# Patient Record
Sex: Male | Born: 1965 | Race: White | Hispanic: No | Marital: Married | Smoking: Never smoker
Health system: Southern US, Community
[De-identification: ages and names within clinical notes are randomized; demographics above are authoritative.]

## PROBLEM LIST (undated history)

## (undated) HISTORY — PX: HERNIA REPAIR: SHX51

---

## 1997-07-23 ENCOUNTER — Emergency Department (HOSPITAL_COMMUNITY): Admission: EM | Admit: 1997-07-23 | Discharge: 1997-07-23 | Payer: Self-pay | Admitting: Emergency Medicine

## 2002-04-16 ENCOUNTER — Emergency Department (HOSPITAL_COMMUNITY): Admission: EM | Admit: 2002-04-16 | Discharge: 2002-04-16 | Payer: Self-pay | Admitting: Emergency Medicine

## 2002-04-17 ENCOUNTER — Encounter: Payer: Self-pay | Admitting: Emergency Medicine

## 2002-08-13 ENCOUNTER — Ambulatory Visit (HOSPITAL_COMMUNITY): Admission: RE | Admit: 2002-08-13 | Discharge: 2002-08-13 | Payer: Self-pay | Admitting: Gastroenterology

## 2013-01-31 ENCOUNTER — Emergency Department (HOSPITAL_COMMUNITY): Payer: Self-pay

## 2013-01-31 ENCOUNTER — Observation Stay (HOSPITAL_COMMUNITY)
Admission: EM | Admit: 2013-01-31 | Discharge: 2013-02-02 | Disposition: A | Discharge status: Home / Self Care | Payer: Self-pay | Attending: Internal Medicine | Admitting: Internal Medicine

## 2013-01-31 ENCOUNTER — Encounter (HOSPITAL_COMMUNITY): Payer: Self-pay | Admitting: Emergency Medicine

## 2013-01-31 DIAGNOSIS — R0789 Other chest pain: Principal | ICD-10-CM | POA: Insufficient documentation

## 2013-01-31 DIAGNOSIS — R079 Chest pain, unspecified: Secondary | ICD-10-CM | POA: Diagnosis present

## 2013-01-31 DIAGNOSIS — R42 Dizziness and giddiness: Secondary | ICD-10-CM | POA: Insufficient documentation

## 2013-01-31 DIAGNOSIS — R519 Headache, unspecified: Secondary | ICD-10-CM | POA: Diagnosis present

## 2013-01-31 DIAGNOSIS — E876 Hypokalemia: Secondary | ICD-10-CM | POA: Insufficient documentation

## 2013-01-31 DIAGNOSIS — I200 Unstable angina: Secondary | ICD-10-CM

## 2013-01-31 DIAGNOSIS — R0602 Shortness of breath: Secondary | ICD-10-CM | POA: Insufficient documentation

## 2013-01-31 DIAGNOSIS — T463X5A Adverse effect of coronary vasodilators, initial encounter: Secondary | ICD-10-CM | POA: Insufficient documentation

## 2013-01-31 DIAGNOSIS — M25529 Pain in unspecified elbow: Secondary | ICD-10-CM | POA: Insufficient documentation

## 2013-01-31 DIAGNOSIS — L301 Dyshidrosis [pompholyx]: Secondary | ICD-10-CM | POA: Insufficient documentation

## 2013-01-31 DIAGNOSIS — R51 Headache: Secondary | ICD-10-CM

## 2013-01-31 DIAGNOSIS — G4440 Drug-induced headache, not elsewhere classified, not intractable: Secondary | ICD-10-CM | POA: Insufficient documentation

## 2013-01-31 LAB — BASIC METABOLIC PANEL
BUN: 15 mg/dL (ref 6–23)
Calcium: 9.6 mg/dL (ref 8.4–10.5)
Chloride: 100 mEq/L (ref 96–112)
GFR calc non Af Amer: >90 mL/min (ref >90)
Glucose, Bld: 97 mg/dL (ref 70–99)
Potassium: 3.2 mEq/L — ABNORMAL LOW (ref 3.5–5.1)
Sodium: 136 mEq/L (ref 135–145)

## 2013-01-31 LAB — CBC
HCT: 42.1 % (ref 39.0–52.0)
Hemoglobin: 15.5 g/dL (ref 13.0–17.0)
MCH: 32.7 pg (ref 26.0–34.0)
MCHC: 36.8 g/dL — ABNORMAL HIGH (ref 30.0–36.0)
MCV: 88.8 fL (ref 78.0–100.0)
Platelets: 176 10*3/uL (ref 150–400)
RBC: 4.74 MIL/uL (ref 4.22–5.81)

## 2013-01-31 LAB — TROPONIN I: Troponin I: <0.3 ng/mL (ref <0.30)

## 2013-01-31 MED ORDER — POTASSIUM CHLORIDE CRYS ER 20 MEQ PO TBCR
20.0000 meq | EXTENDED_RELEASE_TABLET | Freq: Once | ORAL | Status: AC
  Administered 2013-02-01: 20 meq via ORAL
  Filled 2013-01-31: qty 1

## 2013-01-31 MED ORDER — HEPARIN SODIUM (PORCINE) 5000 UNIT/ML IJ SOLN
5000.0000 [IU] | Freq: Three times a day (TID) | INTRAMUSCULAR | Status: DC
  Administered 2013-02-01: 5000 [IU] via SUBCUTANEOUS
  Filled 2013-01-31 (×4): qty 1

## 2013-01-31 MED ORDER — ACETAMINOPHEN 325 MG PO TABS
650.0000 mg | ORAL_TABLET | ORAL | Status: DC | PRN
  Administered 2013-02-01 (×2): 650 mg via ORAL
  Filled 2013-01-31 (×2): qty 2

## 2013-01-31 MED ORDER — NITROGLYCERIN 0.4 MG SL SUBL
0.4000 mg | SUBLINGUAL_TABLET | SUBLINGUAL | Status: DC | PRN
  Administered 2013-01-31 (×3): 0.4 mg via SUBLINGUAL

## 2013-01-31 MED ORDER — ACETAMINOPHEN 500 MG PO TABS
1000.0000 mg | ORAL_TABLET | Freq: Once | ORAL | Status: AC
  Administered 2013-01-31: 1000 mg via ORAL
  Filled 2013-01-31: qty 2

## 2013-01-31 MED ORDER — ONDANSETRON HCL 4 MG/2ML IJ SOLN
4.0000 mg | Freq: Four times a day (QID) | INTRAMUSCULAR | Status: DC | PRN
Start: 2013-01-31 — End: 2013-02-02

## 2013-01-31 MED ORDER — HYDROCODONE-ACETAMINOPHEN 5-325 MG PO TABS
1.0000 | ORAL_TABLET | Freq: Once | ORAL | Status: AC
  Administered 2013-02-01: 1 via ORAL
  Filled 2013-01-31: qty 1

## 2013-01-31 MED ORDER — ASPIRIN 325 MG PO TABS
325.0000 mg | ORAL_TABLET | ORAL | Status: AC
  Administered 2013-01-31: 325 mg via ORAL
  Filled 2013-01-31: qty 1

## 2013-01-31 NOTE — ED Notes (Signed)
Pt here for c/o chest pain to L chest, radiating to L arm started at 1830. PT states he feels weak and lightheaded at times.

## 2013-01-31 NOTE — ED Notes (Signed)
Pt called for c/o headache. MD notified.

## 2013-01-31 NOTE — ED Provider Notes (Addendum)
CSN: [id number]     Arrival date & time 01/31/13  1959 History   First MD Initiated Contact with Patient 01/31/13 2022     Chief Complaint  Patient presents with  . Chest Pain   (Consider location/radiation/quality/duration/timing/severity/associated sxs/prior Treatment) HPI Comments: Patient presents with chest pain. He started having sudden onset of pain in his left chest about 2 hours prior to arrival in emergency pertinent. Describes as an achy feeling in the left side of his chest it radiates to his neck and left arm. He has associated shortness of breath. He also had some lightheadedness. He denies any nausea or vomiting. He denies any pleuritic-type chest pain. He denies any leg pain or swelling. He denies any cough or chest congestion. He denies any history of heart problems in the past. He denies any tobacco use. He denies a family history of early heart disease.   History reviewed. No pertinent past medical history. History reviewed. No pertinent past surgical history. History reviewed. No pertinent family history. History  Substance Use Topics  . Smoking status: Never Smoker   . Smokeless tobacco: Not on file  . Alcohol Use: No    Review of Systems  Constitutional: Positive for diaphoresis. Negative for fever, chills and fatigue.  HENT: Negative for congestion, rhinorrhea and sneezing.   Eyes: Negative.   Respiratory: Positive for chest tightness and shortness of breath. Negative for cough.   Cardiovascular: Positive for chest pain. Negative for leg swelling.  Gastrointestinal: Negative for nausea, vomiting, abdominal pain, diarrhea and blood in stool.  Genitourinary: Negative for frequency, hematuria, flank pain and difficulty urinating.  Musculoskeletal: Negative for arthralgias and back pain.  Skin: Negative for rash.  Neurological: Positive for light-headedness. Negative for dizziness, speech difficulty, weakness, numbness and headaches.    Allergies  Sulfa  antibiotics  Home Medications  No current outpatient prescriptions on file. BP 94/68  Pulse 67  Temp(Src) 98.3 F (36.8 C) (Oral)  Resp 12  Wt 190 lb (86.183 kg)  SpO2 94% Physical Exam  Constitutional: He is oriented to person, place, and time. He appears well-developed and well-nourished.  Slightly diaphoretic  HENT:  Head: Normocephalic and atraumatic.  Eyes: Pupils are equal, round, and reactive to light.  Neck: Normal range of motion. Neck supple.  Cardiovascular: Normal rate, regular rhythm and normal heart sounds.   Pulmonary/Chest: Effort normal and breath sounds normal. No respiratory distress. He has no wheezes. He has no rales. He exhibits no tenderness.  Abdominal: Soft. Bowel sounds are normal. There is no tenderness. There is no rebound and no guarding.  Musculoskeletal: Normal range of motion. He exhibits no edema.  Lymphadenopathy:    He has no cervical adenopathy.  Neurological: He is alert and oriented to person, place, and time.  Skin: Skin is warm and dry. No rash noted.  Psychiatric: He has a normal mood and affect.    ED Course  Procedures (including critical care time) Labs Review Results for orders placed during the hospital encounter of 01/31/13  CBC      Result Value Range   WBC 6.9  4.0 - 10.5 K/uL   RBC 4.74  4.22 - 5.81 MIL/uL   Hemoglobin 15.5  13.0 - 17.0 g/dL   HCT 78.2  95.6 - 21.3 %   MCV 88.8  78.0 - 100.0 fL   MCH 32.7  26.0 - 34.0 pg   MCHC 36.8 (*) 30.0 - 36.0 g/dL   RDW 08.6  57.8 - 46.9 %  Platelets 176  150 - 400 K/uL  BASIC METABOLIC PANEL      Result Value Range   Sodium 136  135 - 145 mEq/L   Potassium 3.2 (*) 3.5 - 5.1 mEq/L   Chloride 100  96 - 112 mEq/L   CO2 23  19 - 32 mEq/L   Glucose, Bld 97  70 - 99 mg/dL   BUN 15  6 - 23 mg/dL   Creatinine, Ser 1.61  0.50 - 1.35 mg/dL   Calcium 9.6  8.4 - 09.6 mg/dL   GFR calc non Af Amer >90  >90 mL/min   GFR calc Af Amer >90  >90 mL/min  TROPONIN I      Result Value Range    Troponin I <0.30  <0.30 ng/mL   Dg Chest 2 View  01/31/2013   CLINICAL DATA:  Left-sided chest pain, traveling down left arm for 1 day.  EXAM: CHEST  2 VIEW  COMPARISON:  CT abdomen and pelvis 03/15/2008  FINDINGS: Normal heart size and pulmonary vascularity. Scattered interstitial changes in the lungs with scattered calcified granulomas. No focal airspace consolidation. No blunting of costophrenic angles. No pneumothorax.  IMPRESSION: No active cardiopulmonary disease.   Electronically Signed   By: Burman Nieves M.D.   On: 01/31/2013 21:22     Imaging Review Dg Chest 2 View  01/31/2013   CLINICAL DATA:  Left-sided chest pain, traveling down left arm for 1 day.  EXAM: CHEST  2 VIEW  COMPARISON:  CT abdomen and pelvis 03/15/2008  FINDINGS: Normal heart size and pulmonary vascularity. Scattered interstitial changes in the lungs with scattered calcified granulomas. No focal airspace consolidation. No blunting of costophrenic angles. No pneumothorax.  IMPRESSION: No active cardiopulmonary disease.   Electronically Signed   By: Burman Nieves M.D.   On: 01/31/2013 21:22    EKG Interpretation     Ventricular Rate:  71 PR Interval:  184 QRS Duration: 84 QT Interval:  384 QTC Calculation: 418 R Axis:   -8 Text Interpretation:  Sinus rhythm Probable left atrial enlargement since last tracing no significant change            MDM   1. Chest pain    Patient presents with chest pain. He has no ischemic changes on EKG. His troponin is negative. He does however have a concerning story and he was slightly diaphoretic on arrival. His pain is exertional. Given this I feel that it would be best to admit him for serial troponins and monitoring. He did receive aspirin in the ED as well as 3 nitroglycerin and he was pain-free after the nitroglycerin.    Rolan Bucco, MD 01/31/13 0454  Rolan Bucco, MD 01/31/13 2223

## 2013-01-31 NOTE — ED Notes (Addendum)
Presents with chest tightness leftsided with radiation into jaw and left arm began at 18:30 this evening associated with SOB and pain is constant, "Foogy headedness" denies nausea. Nothing makes better, walking and exertion makes pain wrose, lifting left arm up makes pain worse.

## 2013-01-31 NOTE — ED Notes (Signed)
DG called and updated pt ready for xray.

## 2013-01-31 NOTE — H&P (Signed)
[facility] History and Physical  [name] ZOX:[id number] DOB: [birth year] DOA: 01/31/2013  Referring physician: ED PCP: No primary provider on file.  Chief Complaint: Chest pain  HPI: Todd Griffin is a 47 y.o. male who presents with c/o chest pain.  Pain was left sided with radiation to arm and jaw.  Pain was worse with exertion, resolved with NTG.  There was associated SOB.  He is currently pain free.  Symptoms occurred about 2 hours PTA.  Review of Systems: 12 systems reviewed and otherwise negative.  History reviewed. No pertinent past medical history. History reviewed. No pertinent past surgical history. Social History:  reports that he has never smoked. He does not have any smokeless tobacco history on file. He reports that he does not drink alcohol or use illicit drugs.   Allergies  Allergen Reactions  . Sulfa Antibiotics     unknown    History reviewed. No pertinent family history.  Prior to Admission medications   Not on File   Physical Exam: Filed Vitals:   01/31/13 2242  BP: 94/61  Pulse: 68  Temp:   Resp: 17    General:  NAD, resting comfortably in bed Eyes: PEERLA EOMI ENT: mucous membranes moist Neck: supple w/o JVD Cardiovascular: RRR w/o MRG Respiratory: CTA B Abdomen: soft, nt, nd, bs+ Skin: no rash nor lesion Musculoskeletal: MAE, full ROM all 4 extremities Psychiatric: normal tone and affect Neurologic: AAOx3, grossly non-focal  Labs on Admission:  Basic Metabolic Panel:  Recent Labs Lab 01/31/13 2104  NA 136  K 3.2*  CL 100  CO2 23  GLUCOSE 97  BUN 15  CREATININE 0.92  CALCIUM 9.6   Liver Function Tests: No results found for this basename: AST, ALT, ALKPHOS, BILITOT, PROT, ALBUMIN,  in the last 168 hours No results found for this basename: LIPASE, AMYLASE,  in the last 168 hours No results found for this basename: AMMONIA,  in the last 168 hours CBC:  Recent Labs Lab 01/31/13 2104  WBC 6.9  HGB 15.5  HCT 42.1   MCV 88.8  PLT 176   Cardiac Enzymes:  Recent Labs Lab 01/31/13 2104  TROPONINI <0.30    BNP (last 3 results) No results found for this basename: PROBNP,  in the last 8760 hours CBG: No results found for this basename: GLUCAP,  in the last 168 hours  Radiological Exams on Admission: Dg Chest 2 View  01/31/2013   CLINICAL DATA:  Left-sided chest pain, traveling down left arm for 1 day.  EXAM: CHEST  2 VIEW  COMPARISON:  CT abdomen and pelvis 03/15/2008  FINDINGS: Normal heart size and pulmonary vascularity. Scattered interstitial changes in the lungs with scattered calcified granulomas. No focal airspace consolidation. No blunting of costophrenic angles. No pneumothorax.  IMPRESSION: No active cardiopulmonary disease.   Electronically Signed   By: Burman Nieves M.D.   On: 01/31/2013 21:22    EKG: Independently reviewed.  Assessment/Plan Principal Problem:   Chest pain   1. Chest pain - HEART score of 4, but his history especially certianly sounds almost classic for cardiac angina symptoms.  Admitting for serial troponins, tele monitor, despite his low heart score likely will require stress test wether inpatient or outpatient.    Code Status: Full (must indicate code status--if unknown or must be presumed, indicate so) Family Communication: Wife at bedside (indicate person spoken with, if applicable, with phone number if by telephone) Disposition Plan: Admit to obs (indicate anticipated LOS)  Time spent: 50 min  GARDNER, JARED M. [facility] Pager [phone]  If 7PM-7AM, please contact night-coverage [email or web address] Password [email or web address] 01/31/2013, 11:21 PM

## 2013-02-01 ENCOUNTER — Encounter (HOSPITAL_COMMUNITY)
Admission: EM | Disposition: A | Discharge status: Home / Self Care | Payer: Self-pay | Source: Home / Self Care | Attending: Emergency Medicine

## 2013-02-01 DIAGNOSIS — I200 Unstable angina: Secondary | ICD-10-CM

## 2013-02-01 DIAGNOSIS — R51 Headache: Secondary | ICD-10-CM | POA: Diagnosis present

## 2013-02-01 DIAGNOSIS — E876 Hypokalemia: Secondary | ICD-10-CM | POA: Diagnosis present

## 2013-02-01 DIAGNOSIS — R079 Chest pain, unspecified: Secondary | ICD-10-CM

## 2013-02-01 DIAGNOSIS — R519 Headache, unspecified: Secondary | ICD-10-CM | POA: Diagnosis present

## 2013-02-01 HISTORY — PX: LEFT HEART CATHETERIZATION WITH CORONARY ANGIOGRAM: SHX5451

## 2013-02-01 LAB — BASIC METABOLIC PANEL
BUN: 13 mg/dL (ref 6–23)
CO2: 26 mEq/L (ref 19–32)
Calcium: 9 mg/dL (ref 8.4–10.5)
Chloride: 102 mEq/L (ref 96–112)
GFR calc non Af Amer: >90 mL/min (ref >90)
Glucose, Bld: 86 mg/dL (ref 70–99)
Potassium: 3.8 mEq/L (ref 3.5–5.1)
Sodium: 138 mEq/L (ref 135–145)

## 2013-02-01 LAB — PROTIME-INR: INR: 0.94 (ref 0.00–1.49)

## 2013-02-01 SURGERY — LEFT HEART CATHETERIZATION WITH CORONARY ANGIOGRAM
Anesthesia: LOCAL

## 2013-02-01 MED ORDER — SODIUM CHLORIDE 0.9 % IJ SOLN: 3.0000 mL | INTRAMUSCULAR | Status: DC | PRN

## 2013-02-01 MED ORDER — SODIUM CHLORIDE 0.9 % IV SOLN: 250.0000 mL | INTRAVENOUS | Status: DC | PRN

## 2013-02-01 MED ORDER — FENTANYL CITRATE 0.05 MG/ML IJ SOLN
INTRAMUSCULAR | Status: AC
  Filled 2013-02-01: qty 2

## 2013-02-01 MED ORDER — HYDROCODONE-ACETAMINOPHEN 5-325 MG PO TABS
1.0000 | ORAL_TABLET | Freq: Once | ORAL | Status: AC
  Administered 2013-02-01: 1 via ORAL
  Filled 2013-02-01: qty 1

## 2013-02-01 MED ORDER — ASPIRIN 81 MG PO CHEW
81.0000 mg | CHEWABLE_TABLET | ORAL | Status: AC
  Administered 2013-02-01: 81 mg via ORAL
  Filled 2013-02-01: qty 1

## 2013-02-01 MED ORDER — LIDOCAINE HCL (PF) 1 % IJ SOLN
INTRAMUSCULAR | Status: AC
  Filled 2013-02-01: qty 30

## 2013-02-01 MED ORDER — VERAPAMIL HCL 2.5 MG/ML IV SOLN
INTRAVENOUS | Status: AC
  Filled 2013-02-01: qty 2

## 2013-02-01 MED ORDER — SODIUM CHLORIDE 0.9 % IV SOLN: INTRAVENOUS | Status: AC

## 2013-02-01 MED ORDER — HEPARIN SODIUM (PORCINE) 1000 UNIT/ML IJ SOLN
INTRAMUSCULAR | Status: AC
  Filled 2013-02-01: qty 1

## 2013-02-01 MED ORDER — SODIUM CHLORIDE 0.9 % IV SOLN
INTRAVENOUS | Status: DC
  Administered 2013-02-01: 1000 mL via INTRAVENOUS

## 2013-02-01 MED ORDER — SODIUM CHLORIDE 0.9 % IV SOLN
INTRAVENOUS | Status: DC
  Administered 2013-02-01: 20:00:00 via INTRAVENOUS

## 2013-02-01 MED ORDER — HYDROCODONE-ACETAMINOPHEN 5-325 MG PO TABS
1.0000 | ORAL_TABLET | ORAL | Status: DC | PRN
  Administered 2013-02-01: 1 via ORAL
  Filled 2013-02-01: qty 1

## 2013-02-01 MED ORDER — NITROGLYCERIN 0.2 MG/ML ON CALL CATH LAB
INTRAVENOUS | Status: AC
  Filled 2013-02-01: qty 1

## 2013-02-01 MED ORDER — MIDAZOLAM HCL 2 MG/2ML IJ SOLN
INTRAMUSCULAR | Status: AC
  Filled 2013-02-01: qty 2

## 2013-02-01 MED ORDER — SODIUM CHLORIDE 0.9 % IJ SOLN
3.0000 mL | Freq: Two times a day (BID) | INTRAMUSCULAR | Status: DC
  Administered 2013-02-01: 3 mL via INTRAVENOUS

## 2013-02-01 MED ORDER — HEPARIN (PORCINE) IN NACL 2-0.9 UNIT/ML-% IJ SOLN
INTRAMUSCULAR | Status: AC
  Filled 2013-02-01: qty 1000

## 2013-02-01 NOTE — Interval H&P Note (Signed)
History and Physical Interval Note:  02/01/2013 3:22 PM  Todd Griffin  has presented today for cardiac cath with the diagnosis of unstable angina. The various methods of treatment have been discussed with the patient and family. After consideration of risks, benefits and other options for treatment, the patient has consented to  Procedure(s): LEFT HEART CATHETERIZATION WITH CORONARY ANGIOGRAM (N/A) as a surgical intervention .  The patient's history has been reviewed, patient examined, no change in status, stable for surgery.  I have reviewed the patient's chart and labs.  Questions were answered to the patient's satisfaction.    Cath Lab Visit (complete for each Cath Lab visit)  Clinical Evaluation Leading to the Procedure:   ACS: no  Non-ACS:    Anginal Classification: CCS III  Anti-ischemic medical therapy: No Therapy  Non-Invasive Test Results: No non-invasive testing performed  Prior CABG: No previous CABG        MCALHANY,CHRISTOPHER

## 2013-02-01 NOTE — Progress Notes (Signed)
UR Completed Wateen Varon Graves-Bigelow, RN,BSN [phone]  

## 2013-02-01 NOTE — Progress Notes (Signed)
Pt c/o pain in R forearm, 8/10; site without hematoma, good wave form on pulse ox, pt 94% on RA; Janell Quiet paged and made aware, new orders received; will continue to monitor

## 2013-02-01 NOTE — H&P (View-Only) (Signed)
    CONSULT NOTE  Date: 02/01/2013               Patient Name:  Todd Griffin MRN: [id number]  DOB: 04/05/1966 Age / Sex: 47 y.o., male        PCP: BURNETT,BRENT A Primary Cardiologist: New/ Nahser            Referring Physician: Corina Sullivan, MD              Reason for Consult: Chest pain, unstable angina           History of Present Illness: Patient is a 47 y.o. male with no significant PMH , who was admitted to [facility] on 01/31/2013 for evaluation of chest discomfort.    He had CP for 2 hours - relieved with SL NTG.   ECG is nonacute.  The pain was 10 / 10.  The  Severe pressure, tightness, radiated up to left shoulder and then down left arm.  Worsened while walking into the ER and then was relieved with SL NTG.  Non - pleuretic.     Medications: Outpatient medications: No prescriptions prior to admission    Current medications: Current Facility-Administered Medications  Medication Dose Route Frequency Provider Last Rate Last Dose  . acetaminophen (TYLENOL) tablet 650 mg  650 mg Oral Q4H PRN Jared M. Gardner, DO   650 mg at 02/01/13 0802  . heparin injection 5,000 Units  5,000 Units Subcutaneous Q8H Jared M. Gardner, DO      . nitroGLYCERIN (NITROSTAT) SL tablet 0.4 mg  0.4 mg Sublingual Q5 min PRN Melanie Belfi, MD   0.4 mg at 01/31/13 2046  . ondansetron (ZOFRAN) injection 4 mg  4 mg Intravenous Q6H PRN Jared M. Gardner, DO         Allergies  Allergen Reactions  . Sulfa Antibiotics     unknown     History reviewed. No pertinent past medical history.  History reviewed. No pertinent past surgical history.  History reviewed. No pertinent family history.  Social History:  reports that he has never smoked. He does not have any smokeless tobacco history on file. He reports that he does not drink alcohol or use illicit drugs.   Review of Systems: Constitutional:  denies fever, chills, diaphoresis, appetite change and fatigue.  HEENT: denies photophobia, eye  pain, redness, hearing loss, ear pain, congestion, sore throat, rhinorrhea, sneezing, neck pain, neck stiffness and tinnitus.  Respiratory: denies SOB, DOE, cough, chest tightness, and wheezing.  Cardiovascular: admits to chest pain   Gastrointestinal: denies nausea, vomiting, abdominal pain, diarrhea, constipation, blood in stool.  Genitourinary: denies dysuria, urgency, frequency, hematuria, flank pain and difficulty urinating.  Musculoskeletal: denies  myalgias, back pain, joint swelling, arthralgias and gait problem.   Skin: denies pallor, rash and wound.  Neurological: denies dizziness, seizures, syncope, weakness, light-headedness, numbness and headaches.   Hematological: denies adenopathy, easy bruising, personal or family bleeding history.  Psychiatric/ Behavioral: denies suicidal ideation, mood changes, confusion, nervousness, sleep disturbance and agitation.    Physical Exam: BP 95/64  Pulse 61  Temp(Src) 97.7 F (36.5 C) (Oral)  Resp 16  Ht 5' 10" (1.778 m)  Wt 188 lb 12.8 oz (85.639 kg)  BMI 27.09 kg/m2  SpO2 96%  General: Vital signs reviewed and noted. Well-developed, well-nourished, in no acute distress; alert, appropriate and cooperative throughout examination.  Head: Normocephalic, atraumatic, sclera anicteric, mucus membranes are moist  Neck: Supple. Negative for carotid bruits. JVD not elevated.  Lungs:    Clear bilaterally to auscultation without wheezes, rales, or rhonchi. Breathing is normal.   Heart: RRR with S1 S2. No murmurs, rubs, or gallops.  Chest wall is nontender.  Abdomen:  Soft, non-tender, non-distended with normoactive bowel sounds. No hepatomegaly. No rebound/guarding. No obvious abdominal masses  MSK: Strength and the appear normal for age.  Extremities: No clubbing or cyanosis. No edema.  Distal pedal pulses are 2+ and equal bilaterally.  Neurologic: Alert and oriented X 3. Moves all extremities spontaneously.  Psych: Responds to questions  appropriately with a normal affect.    Lab results: Basic Metabolic Panel:  Recent Labs Lab 01/31/13 2104  NA 136  K 3.2*  CL 100  CO2 23  GLUCOSE 97  BUN 15  CREATININE 0.92  CALCIUM 9.6    Liver Function Tests: No results found for this basename: AST, ALT, ALKPHOS, BILITOT, PROT, ALBUMIN,  in the last 168 hours No results found for this basename: LIPASE, AMYLASE,  in the last 168 hours No results found for this basename: AMMONIA,  in the last 168 hours  CBC:  Recent Labs Lab 01/31/13 2104  WBC 6.9  HGB 15.5  HCT 42.1  MCV 88.8  PLT 176    Cardiac Enzymes:  Recent Labs Lab 01/31/13 2104 02/01/13 0723  TROPONINI <0.30 <0.30    BNP: No components found with this basename: POCBNP,   CBG: No results found for this basename: GLUCAP,  in the last 168 hours  Coagulation Studies: No results found for this basename: LABPROT, INR,  in the last 72 hours   Other results: EKG: NSR, no ST or T wave changes.   Imaging: Dg Chest 2 View  01/31/2013   CLINICAL DATA:  Left-sided chest pain, traveling down left arm for 1 day.  EXAM: CHEST  2 VIEW  COMPARISON:  CT abdomen and pelvis 03/15/2008  FINDINGS: Normal heart size and pulmonary vascularity. Scattered interstitial changes in the lungs with scattered calcified granulomas. No focal airspace consolidation. No blunting of costophrenic angles. No pneumothorax.  IMPRESSION: No active cardiopulmonary disease.   Electronically Signed   By: William  Stevens M.D.   On: 01/31/2013 21:22      Assessment & Plan: 1. Chest discomfort.  Unstable angina.  His symptoms are worrisome.   The pain was typical for unstable angina - pressure, radiation to shoulder and arm, relieved with SL NTG.  The pain was not pleuretic.  No chest wall tenderness. Not associated with eating.   I would favor doing a cardiac cath today.  I have discussed the risks, benefits, options.  He and his wife understand and agree to proceed.   Will give him  some liquids this am. Will start IVF   Philip J. Nahser, Jr., MD, FACC 02/01/2013, 10:06 AM      

## 2013-02-01 NOTE — CV Procedure (Signed)
      Cardiac Catheterization Operative Report  Todd Griffin [id number] 10/20/20143:28 PM Marjory Lies A, MD  Procedure Performed:  1. Left Heart Catheterization 2. Selective Coronary Angiography 3. Left ventricular angiogram  Operator: Verne Carrow, MD  Arterial access site:  Right radial artery.   Indication:  47 yo male with no signficant past medical history, non-smoker, no FH of CAD admitted with chest pain concerning for unstable angina. Cardiac markers negative.                                      Procedure Details: The risks, benefits, complications, treatment options, and expected outcomes were discussed with the patient. The patient and/or family concurred with the proposed plan, giving informed consent. The patient was brought to the cath lab after IV hydration was begun and oral premedication was given. The patient was further sedated with Versed and Fentanyl. The right wrist was assessed with an Allens test which was positive. The right wrist was prepped and draped in a sterile fashion. 1% lidocaine was used for local anesthesia. Using the modified Seldinger access technique, a 5/6 [place] hybrid sheath was placed in the right radial artery. 3 mg Verapamil was given through the sheath. 4500 units IV heparin was given. Standard diagnostic catheters were used to perform selective coronary angiography. A pigtail catheter was used to perform a left ventricular angiogram. The sheath was removed from the right radial artery and a Terumo hemostasis band was applied at the arteriotomy site on the right wrist.   There were no immediate complications. The patient was taken to the recovery area in stable condition.   Hemodynamic Findings: Central aortic pressure: 88/52 Left ventricular pressure: 82/4/5  Angiographic Findings:  Left main: No obstructive disease.   Left Anterior Descending Artery: Large caliber vessel that courses to the apex. There are two small to  moderate caliber diagonal branches. No obstructive disease.   Circumflex Artery: Large caliber vessel with two moderate caliber obtuse marginal branches. No obstructive disease.   Right Coronary Artery: Large dominant vessel with no obstructive disease.   Left Ventricular Angiogram: LVEF=60%.   Impression: 1. No angiographic evidence of CAD 2. Normal LV systolic function 3. Non-cardiac chest pain.   Recommendations: No further ischemic workup.        Complications:  None. The patient tolerated the procedure well.

## 2013-02-01 NOTE — Progress Notes (Addendum)
Chart reviewed.  PCP is Dr. Rosezetta Schlatter  Subjective: Some arm and shoulder pain. Better with elevation.  No CP. C/o HA unrelieved by tylenol.  Has not eaten yet.  CP worse with exertion yesterday, diaphoretic, dyspneic, relieved by nitroglycerin. Uncles and grandparents with early heart disease.  Questionable h/o hyperlipidemia.  Objective: Vital signs in last 24 hours: Temp:  [97.7 F (36.5 C)-98.3 F (36.8 C)] 97.7 F (36.5 C) (10/20 0543) Pulse Rate:  [61-79] 61 (10/20 0543) Resp:  [12-18] 16 (10/20 0543) BP: (93-110)/(61-80) 95/64 mmHg (10/20 0543) SpO2:  [91 %-98 %] 96 % (10/20 0543) Weight:  [85.639 kg (188 lb 12.8 oz)-86.183 kg (190 lb)] 85.639 kg (188 lb 12.8 oz) (10/19 2339) Weight change:  Last BM Date: 01/30/13  Intake/Output from previous day:   Intake/Output this shift:   Gen:  Comfortable. Left arm resting on pillow. Lungs CTA without WRR CV RRR without MGR. Mild chest wall tenderness, but does not reproduce yesterday's symptoms Abd:  S, NT, ND Ext: no CCE.  Left shoulder and elbow nontender, full ROM  Lab Results:  Recent Labs  01/31/13 2104  WBC 6.9  HGB 15.5  HCT 42.1  PLT 176   BMET  Recent Labs  01/31/13 2104  NA 136  K 3.2*  CL 100  CO2 23  GLUCOSE 97  BUN 15  CREATININE 0.92  CALCIUM 9.6    Studies/Results: Dg Chest 2 View  01/31/2013   CLINICAL DATA:  Left-sided chest pain, traveling down left arm for 1 day.  EXAM: CHEST  2 VIEW  COMPARISON:  CT abdomen and pelvis 03/15/2008  FINDINGS: Normal heart size and pulmonary vascularity. Scattered interstitial changes in the lungs with scattered calcified granulomas. No focal airspace consolidation. No blunting of costophrenic angles. No pneumothorax.  IMPRESSION: No active cardiopulmonary disease.   Electronically Signed   By: Burman Nieves M.D.   On: 01/31/2013 21:22    Medications:  Scheduled Meds: . heparin  5,000 Units Subcutaneous Q8H   Continuous Infusions:  PRN  Meds:.acetaminophen, nitroGLYCERIN, ondansetron (ZOFRAN) IV  Assessment/Plan:  1. Chest pain:  MI ruled out & EKG ok.  Some components concerning for angina (worse with exertion, relieved by nitro).  Will keep NPO and consult cardiology to consider inpatient ischemia workup.  2. Headache from nitro: tylenol or vicodin x1  Hypokalemia: received KDur   LOS: 1 day   Todd Griffin L 02/01/2013, 9:25 AM

## 2013-02-01 NOTE — Progress Notes (Signed)
Called to see pt re: oozing at cath site. RN had tried several times to pull air out of the TR band, but the site would begin oozing and she would re-inflate. Pt still having pain at the site as well, with significant pain in his arm, radiating up to his elbow/shoulder.   On exam, no bleeding now, band still inflated. Good waveform on monitor. Fingers are pink, good capillary refill. Pt has movement of fingers. No oozing currently at site, no hematoma.  Family in the room. Discussed the situation with pt and family. Advised them that I would increase pain Rx overnight. Would also add IVF to continue hydration and help keep BP up. OK to check band q 30 minutes, but as long as he has good circulation to his right hand, no issues with it staying in place longer than usual.  Re-assess in am, d/c then if doing OK.

## 2013-02-01 NOTE — Discharge Summary (Signed)
Physician Discharge Summary  Todd Griffin ZOX:[id number] DOB: 1966-03-10 DOA: 01/31/2013  PCP: Delorse Lek, MD  Admit date: 01/31/2013 Discharge date: 02/01/2013  Recommendations for Outpatient Follow-up:  1.   Discharge Diagnoses:  Principal Problem:   Chest pain Active Problems:   Headache(784.0)   Hypokalemia   Discharge Condition: stable  Filed Weights   01/31/13 2007 01/31/13 2339  Weight: 86.183 kg (190 lb) 85.639 kg (188 lb 12.8 oz)    History of present illness:  47 y.o. male who presents with c/o chest pain. Pain was left sided with radiation to arm and jaw. Pain was worse with exertion, resolved with NTG. There was associated SOB. He is currently pain free. Symptoms occurred about 2 hours PTA.  Hospital Course:  Admitted to hospitalists. Myocardial infarction ruled out. Cardiology consulted and the patient for cardiac catheterization which showed normal coronariesand normal LV systolic function.  Procedures:  Cardiac catheterization  Consultations: [place] [facility]  Discharge Exam: Filed Vitals:   02/01/13 1526  BP:   Pulse: 68  Temp:   Resp:     Cardiovascular: Regular rate rhythm without murmurs gallops rubs Respiratory: clear to auscultation bilaterally without wheezes rhonchi or rales  Discharge Instructions  Discharge Orders   Future Orders Complete By Expires   Activity as tolerated - No restrictions  As directed    Diet general  As directed        Medication List    Notice   You have not been prescribed any medications.     Allergies  Allergen Reactions  . Sulfa Antibiotics     unknown       Follow-up Information   Follow up with BURNETT,BRENT A, MD. (If symptoms worsen)    Specialty:  Family Medicine   Contact information:   [address] [place] [address] [phone]        The results of significant diagnostics from this hospitalization (including imaging, microbiology, ancillary and  laboratory) are listed below for reference.    Significant Diagnostic Studies: Dg Chest 2 View  01/31/2013   CLINICAL DATA:  Left-sided chest pain, traveling down left arm for 1 day.  EXAM: CHEST  2 VIEW  COMPARISON:  CT abdomen and pelvis 03/15/2008  FINDINGS: Normal heart size and pulmonary vascularity. Scattered interstitial changes in the lungs with scattered calcified granulomas. No focal airspace consolidation. No blunting of costophrenic angles. No pneumothorax.  IMPRESSION: No active cardiopulmonary disease.   Electronically Signed   By: Burman Nieves M.D.   On: 01/31/2013 21:22    Microbiology: No results found for this or any previous visit (from the past 240 hour(s)).   Labs: Basic Metabolic Panel:  Recent Labs Lab 01/31/13 2104 02/01/13 1126  NA 136 138  K 3.2* 3.8  CL 100 102  CO2 23 26  GLUCOSE 97 86  BUN 15 13  CREATININE 0.92 0.91  CALCIUM 9.6 9.0   Liver Function Tests: No results found for this basename: AST, ALT, ALKPHOS, BILITOT, PROT, ALBUMIN,  in the last 168 hours No results found for this basename: LIPASE, AMYLASE,  in the last 168 hours No results found for this basename: AMMONIA,  in the last 168 hours CBC:  Recent Labs Lab 01/31/13 2104  WBC 6.9  HGB 15.5  HCT 42.1  MCV 88.8  PLT 176   Cardiac Enzymes:  Recent Labs Lab 01/31/13 2104 02/01/13 0723 02/01/13 1130  TROPONINI <0.30 <0.30 <0.30   BNP: BNP (last 3 results) No results  found for this basename: PROBNP,  in the last 8760 hours CBG: No results found for this basename: GLUCAP,  in the last 168 hours  EKG Sinus rhythm Probable left atrial enlargement  Signed:  Elhadj Girton L  [facility] 02/01/2013, 4:39 PM

## 2013-02-01 NOTE — Progress Notes (Signed)
Pt continuing to re-bleed after only removing 1cc of air from TR band, site level 0; pt still c/o pain from R forearm; Janell Quiet paged and made aware, was told someone will be stopping by to assess. Will continue to monitor

## 2013-02-01 NOTE — Consult Note (Signed)
CONSULT NOTE  Date: 02/01/2013               Patient Name:  Todd Griffin MRN: [id number]  DOB: May 01, 1965 Age / Sex: 47 y.o., male        PCP: Marjory Lies A Primary Cardiologist: New/ Nahser            Referring Physician: Darnelle Spangle, MD              Reason for Consult: Chest pain, unstable angina           History of Present Illness: Patient is a 47 y.o. male with no significant PMH , who was admitted to [facility] on 01/31/2013 for evaluation of chest discomfort.    He had CP for 2 hours - relieved with SL NTG.   ECG is nonacute.  The pain was 10 / 10.  The  Severe pressure, tightness, radiated up to left shoulder and then down left arm.  Worsened while walking into the ER and then was relieved with SL NTG.  Non - pleuretic.     Medications: Outpatient medications: No prescriptions prior to admission    Current medications: Current Facility-Administered Medications  Medication Dose Route Frequency Provider Last Rate Last Dose  . acetaminophen (TYLENOL) tablet 650 mg  650 mg Oral Q4H PRN Hillary Bow, DO   650 mg at 02/01/13 0802  . heparin injection 5,000 Units  5,000 Units Subcutaneous Q8H Hillary Bow, DO      . nitroGLYCERIN (NITROSTAT) SL tablet 0.4 mg  0.4 mg Sublingual Q5 min PRN Rolan Bucco, MD   0.4 mg at 01/31/13 2046  . ondansetron (ZOFRAN) injection 4 mg  4 mg Intravenous Q6H PRN Hillary Bow, DO         Allergies  Allergen Reactions  . Sulfa Antibiotics     unknown     History reviewed. No pertinent past medical history.  History reviewed. No pertinent past surgical history.  History reviewed. No pertinent family history.  Social History:  reports that he has never smoked. He does not have any smokeless tobacco history on file. He reports that he does not drink alcohol or use illicit drugs.   Review of Systems: Constitutional:  denies fever, chills, diaphoresis, appetite change and fatigue.  HEENT: denies photophobia, eye  pain, redness, hearing loss, ear pain, congestion, sore throat, rhinorrhea, sneezing, neck pain, neck stiffness and tinnitus.  Respiratory: denies SOB, DOE, cough, chest tightness, and wheezing.  Cardiovascular: admits to chest pain   Gastrointestinal: denies nausea, vomiting, abdominal pain, diarrhea, constipation, blood in stool.  Genitourinary: denies dysuria, urgency, frequency, hematuria, flank pain and difficulty urinating.  Musculoskeletal: denies  myalgias, back pain, joint swelling, arthralgias and gait problem.   Skin: denies pallor, rash and wound.  Neurological: denies dizziness, seizures, syncope, weakness, light-headedness, numbness and headaches.   Hematological: denies adenopathy, easy bruising, personal or family bleeding history.  Psychiatric/ Behavioral: denies suicidal ideation, mood changes, confusion, nervousness, sleep disturbance and agitation.    Physical Exam: BP 95/64  Pulse 61  Temp(Src) 97.7 F (36.5 C) (Oral)  Resp 16  Ht 5\' 10"  (1.778 m)  Wt 188 lb 12.8 oz (85.639 kg)  BMI 27.09 kg/m2  SpO2 96%  General: Vital signs reviewed and noted. Well-developed, well-nourished, in no acute distress; alert, appropriate and cooperative throughout examination.  Head: Normocephalic, atraumatic, sclera anicteric, mucus membranes are moist  Neck: Supple. Negative for carotid bruits. JVD not elevated.  Lungs:  Clear bilaterally to auscultation without wheezes, rales, or rhonchi. Breathing is normal.   Heart: RRR with S1 S2. No murmurs, rubs, or gallops.  Chest wall is nontender.  Abdomen:  Soft, non-tender, non-distended with normoactive bowel sounds. No hepatomegaly. No rebound/guarding. No obvious abdominal masses  MSK: Strength and the appear normal for age.  Extremities: No clubbing or cyanosis. No edema.  Distal pedal pulses are 2+ and equal bilaterally.  Neurologic: Alert and oriented X 3. Moves all extremities spontaneously.  Psych: Responds to questions  appropriately with a normal affect.    Lab results: Basic Metabolic Panel:  Recent Labs Lab 01/31/13 2104  NA 136  K 3.2*  CL 100  CO2 23  GLUCOSE 97  BUN 15  CREATININE 0.92  CALCIUM 9.6    Liver Function Tests: No results found for this basename: AST, ALT, ALKPHOS, BILITOT, PROT, ALBUMIN,  in the last 168 hours No results found for this basename: LIPASE, AMYLASE,  in the last 168 hours No results found for this basename: AMMONIA,  in the last 168 hours  CBC:  Recent Labs Lab 01/31/13 2104  WBC 6.9  HGB 15.5  HCT 42.1  MCV 88.8  PLT 176    Cardiac Enzymes:  Recent Labs Lab 01/31/13 2104 02/01/13 0723  TROPONINI <0.30 <0.30    BNP: No components found with this basename: POCBNP,   CBG: No results found for this basename: GLUCAP,  in the last 168 hours  Coagulation Studies: No results found for this basename: LABPROT, INR,  in the last 72 hours   Other results: EKG: NSR, no ST or T wave changes.   Imaging: Dg Chest 2 View  01/31/2013   CLINICAL DATA:  Left-sided chest pain, traveling down left arm for 1 day.  EXAM: CHEST  2 VIEW  COMPARISON:  CT abdomen and pelvis 03/15/2008  FINDINGS: Normal heart size and pulmonary vascularity. Scattered interstitial changes in the lungs with scattered calcified granulomas. No focal airspace consolidation. No blunting of costophrenic angles. No pneumothorax.  IMPRESSION: No active cardiopulmonary disease.   Electronically Signed   By: Burman Nieves M.D.   On: 01/31/2013 21:22      Assessment & Plan: 1. Chest discomfort.  Unstable angina.  His symptoms are worrisome.   The pain was typical for unstable angina - pressure, radiation to shoulder and arm, relieved with SL NTG.  The pain was not pleuretic.  No chest wall tenderness. Not associated with eating.   I would favor doing a cardiac cath today.  I have discussed the risks, benefits, options.  He and his wife understand and agree to proceed.   Will give him  some liquids this am. Will start IVF   Alvia Grove., MD, [facility] 02/01/2013, 10:06 AM

## 2013-02-02 DIAGNOSIS — R51 Headache: Secondary | ICD-10-CM

## 2013-02-02 DIAGNOSIS — R079 Chest pain, unspecified: Secondary | ICD-10-CM

## 2013-02-02 NOTE — Progress Notes (Signed)
   Patient Name: Todd Griffin Date of Encounter: 02/02/2013   Principal Problem:   Chest pain Active Problems:   Headache(784.0)   Hypokalemia    SUBJECTIVE  No chest pain. Right elbow pain has almost resolved, no bleeding.  CURRENT MEDS    OBJECTIVE  Filed Vitals:   02/01/13 2230 02/01/13 2312 02/01/13 2345 02/02/13 0531  BP: 88/65 88/65 87/65  97/61  Pulse: 66 64 62 71  Temp:    97.8 F (36.6 C)  TempSrc:      Resp:      Height:      Weight:      SpO2: 91% 91% 92% 93%    Intake/Output Summary (Last 24 hours) at 02/02/13 0825 Last data filed at 02/02/13 0738  Gross per 24 hour  Intake    843 ml  Output      0 ml  Net    843 ml   Filed Weights   01/31/13 2007 01/31/13 2339  Weight: 190 lb (86.183 kg) 188 lb 12.8 oz (85.639 kg)    PHYSICAL EXAM  General: Pleasant, NAD. Neuro: Alert and oriented X 3. Moves all extremities spontaneously. Psych: Normal affect. HEENT:  Normal  Neck: Supple without bruits or JVD. Lungs:  Resp regular and unlabored, CTA. Heart: RRR no s3, s4, or murmurs. Abdomen: Soft, non-tender, non-distended, BS + x 4.  Extremities: No clubbing, cyanosis or edema. DP/PT/Radials 2+ and equal bilaterally.  Accessory Clinical Findings  CBC  Recent Labs  01/31/13 2104  WBC 6.9  HGB 15.5  HCT 42.1  MCV 88.8  PLT 176   Basic Metabolic Panel  Recent Labs  01/31/13 2104 02/01/13 1126  NA 136 138  K 3.2* 3.8  CL 100 102  CO2 23 26  GLUCOSE 97 86  BUN 15 13  CREATININE 0.92 0.91  CALCIUM 9.6 9.0   Cardiac Enzymes  Recent Labs  01/31/13 2104 02/01/13 0723 02/01/13 1130  TROPONINI <0.30 <0.30 <0.30   TELE  SR, no arrhythmias  Radiology/Studies  Dg Chest 2 View  01/31/2013   CLINICAL DATA:  Left-sided chest pain, traveling down left arm for 1 day.  EXAM: CHEST  2 VIEW  COMPARISON:  CT abdomen and pelvis 03/15/2008  FINDINGS: Normal heart size and pulmonary vascularity. Scattered interstitial changes in the lungs  with scattered calcified granulomas. No focal airspace consolidation. No blunting of costophrenic angles. No pneumothorax.  IMPRESSION: No active cardiopulmonary disease.     ASSESSMENT AND PLAN  47 yo male with no signficant past medical history, non-smoker, no FH of CAD admitted with chest pain concerning for unstable angina. Cardiac markers negative. Left cardiac cath showed no angiographic evidence of CAD and normal LV systolic function.  The patient had mild bleeding from the insertion site with right elbow pain yesterday. There are normal pulses at the radial and ulnar artery, normal Allen test, no paresthesias. The patient can be discharged today. He was educated about not carrying heavy things in the right arm for a few days.     Signed, Lars Masson MD

## 2013-08-07 ENCOUNTER — Encounter (HOSPITAL_BASED_OUTPATIENT_CLINIC_OR_DEPARTMENT_OTHER): Payer: Self-pay | Admitting: Emergency Medicine

## 2013-08-07 ENCOUNTER — Emergency Department (HOSPITAL_BASED_OUTPATIENT_CLINIC_OR_DEPARTMENT_OTHER)
Admission: EM | Admit: 2013-08-07 | Discharge: 2013-08-07 | Disposition: A | Discharge status: Home / Self Care | Payer: Self-pay | Attending: Emergency Medicine | Admitting: Emergency Medicine

## 2013-08-07 ENCOUNTER — Emergency Department (HOSPITAL_BASED_OUTPATIENT_CLINIC_OR_DEPARTMENT_OTHER): Payer: Self-pay

## 2013-08-07 DIAGNOSIS — R1012 Left upper quadrant pain: Secondary | ICD-10-CM | POA: Insufficient documentation

## 2013-08-07 DIAGNOSIS — R109 Unspecified abdominal pain: Secondary | ICD-10-CM

## 2013-08-07 LAB — CBC WITH DIFFERENTIAL/PLATELET
BASOS ABS: 0 10*3/uL (ref 0.0–0.1)
Basophils Relative: 1 % (ref 0–1)
Eosinophils Absolute: 0.2 10*3/uL (ref 0.0–0.7)
Eosinophils Relative: 4 % (ref 0–5)
HCT: 45.6 % (ref 39.0–52.0)
Hemoglobin: 16.1 g/dL (ref 13.0–17.0)
LYMPHS ABS: 1.3 10*3/uL (ref 0.7–4.0)
LYMPHS PCT: 23 % (ref 12–46)
MCH: 31.9 pg (ref 26.0–34.0)
MCHC: 35.3 g/dL (ref 30.0–36.0)
MCV: 90.3 fL (ref 78.0–100.0)
MONO ABS: 0.7 10*3/uL (ref 0.1–1.0)
Monocytes Relative: 12 % (ref 3–12)
Neutro Abs: 3.3 10*3/uL (ref 1.7–7.7)
Neutrophils Relative %: 60 % (ref 43–77)
Platelets: 159 10*3/uL (ref 150–400)
RBC: 5.05 MIL/uL (ref 4.22–5.81)
RDW: 11.8 % (ref 11.5–15.5)
WBC: 5.5 10*3/uL (ref 4.0–10.5)

## 2013-08-07 LAB — COMPREHENSIVE METABOLIC PANEL
ALT: 25 U/L (ref 0–53)
AST: 20 U/L (ref 0–37)
Albumin: 4.2 g/dL (ref 3.5–5.2)
Alkaline Phosphatase: 73 U/L (ref 39–117)
BUN: 13 mg/dL (ref 6–23)
CALCIUM: 9.9 mg/dL (ref 8.4–10.5)
CO2: 27 meq/L (ref 19–32)
CREATININE: 1.1 mg/dL (ref 0.50–1.35)
Chloride: 99 mEq/L (ref 96–112)
GFR, EST AFRICAN AMERICAN: 89 mL/min — AB (ref >90)
GFR, EST NON AFRICAN AMERICAN: 77 mL/min — AB (ref >90)
GLUCOSE: 90 mg/dL (ref 70–99)
Potassium: 3.8 mEq/L (ref 3.7–5.3)
Sodium: 138 mEq/L (ref 137–147)
Total Bilirubin: 0.6 mg/dL (ref 0.3–1.2)
Total Protein: 7.6 g/dL (ref 6.0–8.3)

## 2013-08-07 LAB — LIPASE, BLOOD: LIPASE: 47 U/L (ref 11–59)

## 2013-08-07 MED ORDER — MORPHINE SULFATE 4 MG/ML IJ SOLN
4.0000 mg | Freq: Once | INTRAMUSCULAR | Status: AC
  Administered 2013-08-07: 4 mg via INTRAVENOUS
  Filled 2013-08-07: qty 1

## 2013-08-07 MED ORDER — HYDROCODONE-ACETAMINOPHEN 5-325 MG PO TABS: 2.0000 | ORAL_TABLET | ORAL | Status: DC | PRN

## 2013-08-07 NOTE — ED Notes (Signed)
Pt reports LUQ pain x 2 weeks.  Pt been seen for same recently, CT done, blood work done.  Pt dx with colitis.  Pt reports 3 days of abnormal BM.  Denies N/V/D.  Pt tender on palpation.

## 2013-08-07 NOTE — ED Provider Notes (Signed)
CSN: [id number]     Arrival date & time 08/07/13  1131 History   First MD Initiated Contact with Patient 08/07/13 1156     Chief Complaint  Patient presents with  . Abdominal Pain     (Consider location/radiation/quality/duration/timing/severity/associated sxs/prior Treatment) HPI Comments: Patient is a 48 year old male with history of possible colitis. He states he has been having intermittent abdominal pain for the past 15 years. He has been seen by gastroenterology and had a colonoscopy many years ago which was unremarkable. He has had intermittent flareups of this pain that have been increasing in frequency over the past several months. He was seen 2 days ago at [place]. Laboratory studies and CT were all unremarkable. He was prescribed an antibiotic and a pain medication which has not helped. He presents here complaining of worsening pain despite the treatment he received them . He denies to me he is having any diarrhea, constipation, fevers, bloody stool, or other symptoms.  Patient is a 48 y.o. male presenting with abdominal pain. The history is provided by the patient.  Abdominal Pain Pain location:  LUQ and LLQ Pain quality: cramping   Pain radiates to:  Does not radiate Pain severity:  Moderate Timing:  Intermittent Progression:  Worsening Chronicity:  Chronic Relieved by:  Nothing Worsened by:  Nothing tried Ineffective treatments:  None tried   History reviewed. No pertinent past medical history. Past Surgical History  Procedure Laterality Date  . Hernia repair     History reviewed. No pertinent family history. History  Substance Use Topics  . Smoking status: Never Smoker   . Smokeless tobacco: Not on file  . Alcohol Use: No    Review of Systems  Gastrointestinal: Positive for abdominal pain.  All other systems reviewed and are negative.     Allergies  Sulfa antibiotics  Home Medications   Prior to Admission medications   Not on File    BP 102/70  Pulse 86  Temp(Src) 98.5 F (36.9 C) (Oral)  Resp 20  Ht 5\' 10"  (1.778 m)  Wt 190 lb (86.183 kg)  BMI 27.26 kg/m2  SpO2 96% Physical Exam  Nursing note and vitals reviewed. Constitutional: He is oriented to person, place, and time. He appears well-developed and well-nourished. No distress.  HENT:  Head: Normocephalic and atraumatic.  Mouth/Throat: Oropharynx is clear and moist.  Neck: Normal range of motion. Neck supple.  Cardiovascular: Normal rate, regular rhythm and normal heart sounds.   No murmur heard. Pulmonary/Chest: Effort normal and breath sounds normal. No respiratory distress. He has no wheezes.  Abdominal: Soft. Bowel sounds are normal. He exhibits no distension and no mass. There is tenderness. There is no rebound and no guarding.  There is tenderness to palpation in the left upper quadrant. There is no rebound and no guarding. Bowel sounds are present.  Musculoskeletal: Normal range of motion. He exhibits no edema.  Neurological: He is alert and oriented to person, place, and time.  Skin: Skin is warm and dry. He is not diaphoretic.    ED Course  Procedures (including critical care time) Labs Review Labs Reviewed  CBC WITH DIFFERENTIAL  COMPREHENSIVE METABOLIC PANEL  LIPASE, BLOOD  URINALYSIS, ROUTINE W REFLEX MICROSCOPIC    Imaging Review No results found.   EKG Interpretation None      MDM   Final diagnoses:  None    Workup reveals no evidence for acute pathology. His acute abdominal series and ultrasound are unremarkable. His laboratory studies are unchanged with no  leukocytosis or elevation of LFTs. He had a CT scan performed in [place] 2 days ago and I see no indication to repeat this. He will be treated with pain medication and advised to followup with gastroenterology. He is seeing Dr. Loreta AveMann in the past and intends to call on Monday to arrange a followup appointment. He understands to return if his pain worsens, he develops  bloody stool, high fever, or other new and concerning symptoms.    Geoffery Lyonsouglas Larance Ratledge, MD 08/07/13 [id number]

## 2013-08-07 NOTE — Discharge Instructions (Signed)
Hydrocodone as prescribed as needed for pain.  Call gastroenterology on Monday to arrange a followup appointment, and return to the ER if you develop worsening pain, bloody stool, or high fever.   Abdominal Pain, Adult Many things can cause abdominal pain. Usually, abdominal pain is not caused by a disease and will improve without treatment. It can often be observed and treated at home. Your health care provider will do a physical exam and possibly order blood tests and X-rays to help determine the seriousness of your pain. However, in many cases, more time must pass before a clear cause of the pain can be found. Before that point, your health care provider may not know if you need more testing or further treatment. HOME CARE INSTRUCTIONS  Monitor your abdominal pain for any changes. The following actions may help to alleviate any discomfort you are experiencing:  Only take over-the-counter or prescription medicines as directed by your health care provider.  Do not take laxatives unless directed to do so by your health care provider.  Try a clear liquid diet (broth, tea, or water) as directed by your health care provider. Slowly move to a bland diet as tolerated. SEEK MEDICAL CARE IF:  You have unexplained abdominal pain.  You have abdominal pain associated with nausea or diarrhea.  You have pain when you urinate or have a bowel movement.  You experience abdominal pain that wakes you in the night.  You have abdominal pain that is worsened or improved by eating food.  You have abdominal pain that is worsened with eating fatty foods. SEEK IMMEDIATE MEDICAL CARE IF:   Your pain does not go away within 2 hours.  You have a fever.  You keep throwing up (vomiting).  Your pain is felt only in portions of the abdomen, such as the right side or the left lower portion of the abdomen.  You pass bloody or black tarry stools. MAKE SURE YOU:  Understand these instructions.   Will watch  your condition.   Will get help right away if you are not doing well or get worse.  Document Released: 01/09/2005 Document Revised: 01/20/2013 Document Reviewed: 12/09/2012 [facility] Patient Information 2014 [place] [place].

## 2014-01-13 ENCOUNTER — Encounter (HOSPITAL_COMMUNITY): Payer: Self-pay | Admitting: Emergency Medicine

## 2014-01-13 ENCOUNTER — Emergency Department (HOSPITAL_COMMUNITY)
Admission: EM | Admit: 2014-01-13 | Discharge: 2014-01-14 | Disposition: A | Discharge status: Home / Self Care | Payer: Self-pay | Attending: Emergency Medicine | Admitting: Emergency Medicine

## 2014-01-13 ENCOUNTER — Emergency Department (HOSPITAL_COMMUNITY): Payer: Self-pay

## 2014-01-13 DIAGNOSIS — W01198A Fall on same level from slipping, tripping and stumbling with subsequent striking against other object, initial encounter: Secondary | ICD-10-CM | POA: Insufficient documentation

## 2014-01-13 DIAGNOSIS — W19XXXA Unspecified fall, initial encounter: Secondary | ICD-10-CM

## 2014-01-13 DIAGNOSIS — Y990 Civilian activity done for income or pay: Secondary | ICD-10-CM | POA: Insufficient documentation

## 2014-01-13 DIAGNOSIS — W109XXA Fall (on) (from) unspecified stairs and steps, initial encounter: Secondary | ICD-10-CM | POA: Insufficient documentation

## 2014-01-13 DIAGNOSIS — M546 Pain in thoracic spine: Secondary | ICD-10-CM

## 2014-01-13 DIAGNOSIS — S299XXA Unspecified injury of thorax, initial encounter: Secondary | ICD-10-CM | POA: Insufficient documentation

## 2014-01-13 DIAGNOSIS — S52202A Unspecified fracture of shaft of left ulna, initial encounter for closed fracture: Secondary | ICD-10-CM | POA: Insufficient documentation

## 2014-01-13 DIAGNOSIS — Y9301 Activity, walking, marching and hiking: Secondary | ICD-10-CM | POA: Insufficient documentation

## 2014-01-13 DIAGNOSIS — Y9289 Other specified places as the place of occurrence of the external cause: Secondary | ICD-10-CM | POA: Insufficient documentation

## 2014-01-13 MED ORDER — HYDROCODONE-ACETAMINOPHEN 5-325 MG PO TABS: 1.0000 | ORAL_TABLET | ORAL | Status: DC | PRN

## 2014-01-13 MED ORDER — ONDANSETRON HCL 4 MG/2ML IJ SOLN
4.0000 mg | Freq: Once | INTRAMUSCULAR | Status: AC
  Administered 2014-01-13: 4 mg via INTRAVENOUS
  Filled 2014-01-13: qty 2

## 2014-01-13 MED ORDER — SODIUM CHLORIDE 0.9 % IV BOLUS (SEPSIS)
1000.0000 mL | Freq: Once | INTRAVENOUS | Status: AC
  Administered 2014-01-13: 1000 mL via INTRAVENOUS

## 2014-01-13 MED ORDER — HYDROMORPHONE HCL 1 MG/ML IJ SOLN
1.0000 mg | Freq: Once | INTRAMUSCULAR | Status: AC
  Administered 2014-01-13: 1 mg via INTRAVENOUS
  Filled 2014-01-13: qty 1

## 2014-01-13 MED ORDER — HYDROMORPHONE HCL 1 MG/ML IJ SOLN
2.0000 mg | Freq: Once | INTRAMUSCULAR | Status: AC
  Administered 2014-01-13: 2 mg via INTRAVENOUS
  Filled 2014-01-13: qty 2

## 2014-01-13 MED ORDER — IBUPROFEN 800 MG PO TABS
800.0000 mg | ORAL_TABLET | Freq: Once | ORAL | Status: AC
  Administered 2014-01-13: 800 mg via ORAL
  Filled 2014-01-13: qty 1

## 2014-01-13 MED ORDER — HYDROMORPHONE HCL 1 MG/ML IJ SOLN
2.0000 mg | Freq: Once | INTRAMUSCULAR | Status: DC
  Filled 2014-01-13 (×2): qty 2

## 2014-01-13 MED ORDER — ONDANSETRON HCL 4 MG PO TABS: 4.0000 mg | ORAL_TABLET | Freq: Four times a day (QID) | ORAL | Status: DC

## 2014-01-13 NOTE — Progress Notes (Signed)
Orthopedic Tech Progress Note Patient Details:  Todd Griffin 1966-01-24 [id number]  Ortho Devices Type of Ortho Device: Ace wrap;Post (long arm) splint;Arm sling Ortho Device/Splint Location: LUE Ortho Device/Splint Interventions: Ordered;Application   Jennye MoccasinHughes, [name] 01/13/2014, 9:28 PM

## 2014-01-13 NOTE — Discharge Instructions (Signed)
Please follow the directions provided.  Be sure to follow-up with the hand surgeon regarding your ulnar fracture.  You will probably be more sore tomorrow.  You can take ibuprofen 400 mg by mouth every 6 hours for mild to moderate pain or take the vicodin for more sever pain.  If your pain persists, please follow up with your primary care provider.  If your symptoms worsen or become concerning, don't hesitate to return here.   SEEK IMMEDIATE MEDICAL CARE IF:  You have numbness, tingling, or weakness in the arms or legs.  You develop severe headaches not relieved with medicine.  You have severe neck pain, especially tenderness in the middle of the back of your neck.  You have changes in bowel or bladder control.  There is increasing pain in any area of the body.  You have shortness of breath, light-headedness, dizziness, or fainting.  You have chest pain.  You feel sick to your stomach (nauseous), throw up (vomit), or sweat.  You have increasing abdominal discomfort.  There is blood in your urine, stool, or vomit.  You have pain in your shoulder (shoulder strap areas).  You feel your symptoms are getting worse.  SEEK IMMEDIATE MEDICAL CARE IF:  Your cast gets damaged or breaks.  You have more severe pain or swelling than you did before the cast.  You have severe pain when stretching your fingers.  There is a bad smell or new stains and/or purulent (pus like) drainage coming from under the cast.

## 2014-01-13 NOTE — ED Provider Notes (Signed)
Patient presented to the ER for a fall  Face to face Exam: HEENT - PERRLA Lungs - CTAB Heart - RRR, no M/R/G Abd - S/NT/ND Neuro - alert, oriented x3  Plan: Patient with complaints of pain in the left elbow and back. X-ray of spine did not show any acute abnormality. X-ray of elbow shows a very tiny avulsion. Patient will have immobilization, followup with orthopedics. Pain medication and rest her back contusion. Patient does not have any radiation of the pain down the legs, no concern for disc protrusion.   Gilda Creasehristopher J. Clarann Helvey, MD 01/13/14 2101

## 2014-01-13 NOTE — ED Provider Notes (Signed)
CSN: [id number]     Arrival date & time 01/13/14  1819 History   First MD Initiated Contact with Patient 01/13/14 1820     Chief Complaint  Patient presents with  . Fall   (Consider location/radiation/quality/duration/timing/severity/associated sxs/prior Treatment) HPI Todd Griffin is a 48 yo male presenting after a fall today.  He reports it happened just PTA.  He was walking down the steps outside and they were slippery from the rain.  His feet went out from underneath him and he fell on his back, striking his left elbow.  He thinks he may have hit his head but it does not hurt and he did not lose consciousness.  He denies any complaints before the fall. He denies any chest pain, shortness of breath , nausea, vomiting or blurred vision.    History reviewed. No pertinent past medical history. Past Surgical History  Procedure Laterality Date  . Hernia repair     History reviewed. No pertinent family history. History  Substance Use Topics  . Smoking status: Never Smoker   . Smokeless tobacco: Not on file  . Alcohol Use: No    Review of Systems  Constitutional: Negative for fever and chills.  HENT: Negative for sore throat.   Eyes: Negative for visual disturbance.  Respiratory: Negative for cough and shortness of breath.   Cardiovascular: Negative for chest pain and leg swelling.  Gastrointestinal: Negative for nausea, vomiting and diarrhea.  Genitourinary: Negative for dysuria.  Musculoskeletal: Positive for arthralgias, back pain and myalgias.  Skin: Negative for rash.  Neurological: Negative for weakness, numbness and headaches.    Allergies  Sulfa antibiotics  Home Medications   Prior to Admission medications   Medication Sig Start Date End Date Taking? Authorizing Provider  HYDROcodone-acetaminophen (NORCO) 5-325 MG per tablet Take 2 tablets by mouth every 4 (four) hours as needed. 08/07/13   Geoffery Lyons, MD   BP 107/93  Pulse 69  Temp(Src) 97.7 F (36.5 C) (Oral)   Resp 24  Ht 5\' 10"  (1.778 m)  Wt 190 lb (86.183 kg)  BMI 27.26 kg/m2  SpO2 99% Physical Exam  Nursing note and vitals reviewed. Constitutional: He is oriented to person, place, and time. He appears well-developed and well-nourished. No distress.  HENT:  Head: Normocephalic and atraumatic.    Mouth/Throat: Oropharynx is clear and moist. No oropharyngeal exudate.  Eyes: Conjunctivae are normal.  Neck: Neck supple. No thyromegaly present.  Cardiovascular: Normal rate, regular rhythm and intact distal pulses.   Pulmonary/Chest: Effort normal and breath sounds normal. No respiratory distress. He has no wheezes. He has no rales. He exhibits no tenderness.  Abdominal: Soft. There is no tenderness.  Musculoskeletal:       Left elbow: He exhibits swelling. Tenderness found.       Thoracic back: He exhibits tenderness, bony tenderness and swelling.       Back:       Arms: Lymphadenopathy:    He has no cervical adenopathy.  Neurological: He is alert and oriented to person, place, and time. He has normal strength. No cranial nerve deficit or sensory deficit. GCS eye subscore is 4. GCS verbal subscore is 5. GCS motor subscore is 6.  Skin: Skin is warm and dry. No rash noted. He is not diaphoretic.  Psychiatric: He has a normal mood and affect.    ED Course  Procedures (including critical care time) Labs Review Labs Reviewed - No data to display  Imaging Review Dg Cervical Spine Complete  01/13/2014  CLINICAL DATA:  Posterior neck abrasion following a fall down stairs today.  EXAM: CERVICAL SPINE  4+ VIEWS  COMPARISON:  None.  FINDINGS: There is no evidence of cervical spine fracture or prevertebral soft tissue swelling. Alignment is normal. No other significant bone abnormalities are identified.  IMPRESSION: Normal examination.   Electronically Signed   By: Gordan Payment M.D.   On: 01/13/2014 19:59   Dg Thoracic Spine 2 View  01/13/2014   CLINICAL DATA:  Anterior and posterior chest pain  after falling down stairs today.  EXAM: THORACIC SPINE - 2 VIEW  COMPARISON:  Previous chest radiographs. Cervical spine radiographs obtained today.  FINDINGS: Mild scoliosis. Mild lower thoracic spine degenerative changes. No fractures or subluxations seen.  IMPRESSION: No fracture or subluxation.   Electronically Signed   By: Gordan Payment M.D.   On: 01/13/2014 20:00   Dg Lumbar Spine Complete  01/13/2014   CLINICAL DATA:  Low back pain following a fall down stairs today.  EXAM: LUMBAR SPINE - COMPLETE 4+ VIEW  COMPARISON:  Thoracic spine radiographs obtained at the same time. Abdomen and pelvis CT dated 03/15/2008.  FINDINGS: Transitional thoracolumbar vertebra followed by on 5 non-rib-bearing lumbar vertebrae. The patient has 12 full rib-bearing thoracic vertebrae on the thoracic spine radiographs obtained earlier. For counting purposes, the last open disc space is labeled the L5-S1 level. There are bilateral L5 pars interarticularis defects with associated grade 1-2 anterolisthesis at the L5-S1 level. There is also marked posterior disc space narrowing at that level. No acute fractures are seen. Minimal anterior spur formation at the L2-3 level and in the lower thoracic spine.  IMPRESSION: 1. No acute fracture. 2. 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   Electronically Signed   By: Gordan Payment M.D.   On: 01/13/2014 20:05   Dg Elbow Complete Left  01/13/2014   CLINICAL DATA:  48 year old male with a fall down stairs. Left elbow pain.  EXAM: LEFT ELBOW - COMPLETE 3+ VIEW  COMPARISON:  None.  FINDINGS: Lateral view of the left elbow demonstrates questionable small fracture of the ulna. No other acute bony abnormality is identified. No significant soft tissue swelling or joint effusion.  IMPRESSION: Lateral view suggests small fracture of the ulna.  Signed,  Yvone Neu. Loreta Ave, DO  [facility]  [facility]    Electronically Signed   By: Gilmer Mor O.D.   On: 01/13/2014 20:03     EKG Interpretation None      MDM   Final diagnoses:  Fall, initial encounter  Ulna fracture, left, closed, initial encounter  Left-sided thoracic back pain   48 yo male presenting after fall when slipping on wet steps.  Patient without signs of serious head, neck, or back injury. Normal neurological exam. No concern for closed head injury, lung injury, or intraabdominal injury.   Xrays c-spine, t-spine, l-spine negative for acute fracture.  Elbow xray shows ulna fracture.  Posterior splint applied by Ortho tech.    2247: Pt became hypotensive to 70 systolic when standing up to attempt to ambulate.  Pt repositioned back in bed.  BP improved to 94 systolic.  Liter NS and will offer food and PO fluids.     2355:  On re-eval, pt bp maintained 106/78 with standing, reports feeling well with ambulation.     Pt is hemodynamically stable, in NAD, & able to ambulate in the ED. Pain has been managed & has no  complaints prior to dc.  Appears safe to be discharged. Discharge instructions include symptomatic management of pain with ice and heat, ibuprofen for mild to moderate pain, prescription for vicodin for more severe pain.  Referral to hand surgeon for follow-up for ulnar fx.  Instructions to follow-up with PCP if symptoms worsen or don't improve.  Pt and wife verbalize understanding and in agreement with plan.  Return precautions provided.    Filed Vitals:   01/13/14 2300 01/13/14 2330 01/14/14 0000 01/14/14 0015  BP: 99/66 107/69 101/82 100/67  Pulse: 66 75 77 69  Temp:      TempSrc:      Resp:   20 14  Height:      Weight:      SpO2:   99%    Meds given in ED:  Medications  HYDROmorphone (DILAUDID) injection 2 mg (2 mg Intravenous Given 01/13/14 1842)  ondansetron (ZOFRAN) injection 4 mg (4 mg Intravenous Given 01/13/14 2018)  sodium chloride 0.9 % bolus 1,000 mL (0 mLs Intravenous Stopped 01/14/14 0034)    HYDROmorphone (DILAUDID) injection 1 mg (1 mg Intravenous Given 01/13/14 2204)  sodium chloride 0.9 % bolus 1,000 mL (0 mLs Intravenous Stopped 01/14/14 0034)  ibuprofen (ADVIL,MOTRIN) tablet 800 mg (800 mg Oral Given 01/13/14 2333)  HYDROcodone-acetaminophen (NORCO/VICODIN) 5-325 MG per tablet 1 tablet (1 tablet Oral Given 01/14/14 0034)    Discharge Medication List as of 01/14/2014 12:25 AM    START taking these medications   Details  HYDROcodone-acetaminophen (NORCO/VICODIN) 5-325 MG per tablet Take 1-2 tablets by mouth every 4 (four) hours as needed for moderate pain or severe pain., Starting 01/13/2014, Until Discontinued, Print    ondansetron (ZOFRAN) 4 MG tablet Take 1 tablet (4 mg total) by mouth every 6 (six) hours., Starting 01/13/2014, Until Discontinued, Print             Harle BattiestElizabeth Texie Tupou, NP 01/17/14 2134

## 2014-01-13 NOTE — ED Notes (Signed)
Pt to ED via [facility] c/o fall while at work. Reports he slipped and fell 694ft onto some stairs onto his L elbow. C/o L shoulder pain and chest wall pain and into shoulder blades. Also reports difficulty breathing due to pain. No deformity or bruising noted.

## 2014-01-14 MED ORDER — HYDROCODONE-ACETAMINOPHEN 5-325 MG PO TABS
1.0000 | ORAL_TABLET | Freq: Once | ORAL | Status: AC
  Administered 2014-01-14: 1 via ORAL
  Filled 2014-01-14: qty 1

## 2014-01-18 NOTE — ED Provider Notes (Signed)
Medical screening examination/treatment/procedure(s) were performed by non-physician practitioner and as supervising physician I was immediately available for consultation/collaboration.   Yarima Penman J. Domenik Trice, MD 01/18/14 0715 

## 2014-03-24 ENCOUNTER — Encounter (HOSPITAL_COMMUNITY): Payer: Self-pay | Admitting: Cardiovascular Disease

## 2016-05-22 ENCOUNTER — Emergency Department (HOSPITAL_BASED_OUTPATIENT_CLINIC_OR_DEPARTMENT_OTHER): Payer: BLUE CROSS/BLUE SHIELD

## 2016-05-22 ENCOUNTER — Encounter (HOSPITAL_BASED_OUTPATIENT_CLINIC_OR_DEPARTMENT_OTHER): Payer: Self-pay

## 2016-05-22 ENCOUNTER — Emergency Department (HOSPITAL_BASED_OUTPATIENT_CLINIC_OR_DEPARTMENT_OTHER)
Admission: EM | Admit: 2016-05-22 | Discharge: 2016-05-22 | Disposition: A | Discharge status: Home / Self Care | Payer: BLUE CROSS/BLUE SHIELD | Attending: Emergency Medicine | Admitting: Emergency Medicine

## 2016-05-22 DIAGNOSIS — Y9389 Activity, other specified: Secondary | ICD-10-CM | POA: Diagnosis not present

## 2016-05-22 DIAGNOSIS — W0110XA Fall on same level from slipping, tripping and stumbling with subsequent striking against unspecified object, initial encounter: Secondary | ICD-10-CM | POA: Diagnosis not present

## 2016-05-22 DIAGNOSIS — S0990XA Unspecified injury of head, initial encounter: Secondary | ICD-10-CM

## 2016-05-22 DIAGNOSIS — Y999 Unspecified external cause status: Secondary | ICD-10-CM | POA: Diagnosis not present

## 2016-05-22 DIAGNOSIS — Y929 Unspecified place or not applicable: Secondary | ICD-10-CM | POA: Insufficient documentation

## 2016-05-22 DIAGNOSIS — S060X0A Concussion without loss of consciousness, initial encounter: Secondary | ICD-10-CM

## 2016-05-22 DIAGNOSIS — M79602 Pain in left arm: Secondary | ICD-10-CM | POA: Insufficient documentation

## 2016-05-22 DIAGNOSIS — M542 Cervicalgia: Secondary | ICD-10-CM | POA: Diagnosis not present

## 2016-05-22 DIAGNOSIS — S0003XA Contusion of scalp, initial encounter: Secondary | ICD-10-CM | POA: Insufficient documentation

## 2016-05-22 MED ORDER — ONDANSETRON 4 MG PO TBDP
4.0000 mg | ORAL_TABLET | Freq: Once | ORAL | Status: AC | PRN
  Administered 2016-05-22: 4 mg via ORAL
  Filled 2016-05-22: qty 1

## 2016-05-22 MED ORDER — HYDROCODONE-ACETAMINOPHEN 5-325 MG PO TABS
2.0000 | ORAL_TABLET | Freq: Once | ORAL | Status: AC
  Administered 2016-05-22: 2 via ORAL
  Filled 2016-05-22: qty 2

## 2016-05-22 NOTE — ED Triage Notes (Signed)
Pt states he tripped over a pallet and fell backwards striking his head on concrete. Denies LOC. Pt c/o head, neck, and, L elbow pain.

## 2016-05-22 NOTE — ED Provider Notes (Signed)
[facility] Provider Note   CSN: [id number] Arrival date & time: 05/22/16  1014     History   Chief Complaint Chief Complaint  Patient presents with  . Fall    HPI Todd Griffin is a 51 y.o. male.  Patient presents to the emergency department with chief complaint of fall and head injury. He states that he was pulling a piece of material off of a pallet, and tripped backward over another pallet landing on his head. He struck the back of his head on the ground. He complains of headache, neck pain, and left elbow and arm pain. He denies loss of consciousness. Denies nausea or vomiting. He denies any numbness, weakness, or tingling. He has not taken anything for his symptoms. There are no modifying factors. He is not anticoagulated.   The history is provided by the patient. No language interpreter was used.    History reviewed. No pertinent past medical history.  Patient Active Problem List   Diagnosis Date Noted  . Headache(784.0) 02/01/2013  . Hypokalemia 02/01/2013  . Chest pain 01/31/2013    Past Surgical History:  Procedure Laterality Date  . HERNIA REPAIR    . LEFT HEART CATHETERIZATION WITH CORONARY ANGIOGRAM N/A 02/01/2013   Procedure: LEFT HEART CATHETERIZATION WITH CORONARY ANGIOGRAM;  Surgeon: Kathleene Hazel, MD;  Location: [facility] CATH LAB;  Service: Cardiovascular;  Laterality: N/A;       Home Medications    Prior to Admission medications   Medication Sig Start Date End Date Taking? Authorizing Provider  acetaminophen (TYLENOL) 325 MG tablet Take 650 mg by mouth every 6 (six) hours as needed (pain).    Historical Provider, MD  HYDROcodone-acetaminophen (NORCO/VICODIN) 5-325 MG per tablet Take 1-2 tablets by mouth every 4 (four) hours as needed for moderate pain or severe pain. 01/13/14   Harle Battiest, NP  ondansetron (ZOFRAN) 4 MG tablet Take 1 tablet (4 mg total) by mouth every 6 (six) hours. 01/13/14   Harle Battiest, NP    Family  History No family history on file.  Social History Social History  Substance Use Topics  . Smoking status: Never Smoker  . Smokeless tobacco: Never Used  . Alcohol use No     Allergies   Sulfa antibiotics   Review of Systems Review of Systems  All other systems reviewed and are negative.    Physical Exam Updated Vital Signs BP 105/75 (BP Location: Right Arm)   Pulse 73   Temp 97.8 F (36.6 C) (Oral)   Resp 18   Ht 5\' 10"  (1.778 m)   Wt 86.2 kg   SpO2 96%   BMI 27.26 kg/m   Physical Exam  Constitutional: He is oriented to person, place, and time. He appears well-developed and well-nourished.  HENT:  Head: Normocephalic and atraumatic.  4x4 cm posterior scalp hematoma  Eyes: Conjunctivae and EOM are normal. Pupils are equal, round, and reactive to light. Right eye exhibits no discharge. Left eye exhibits no discharge. No scleral icterus.  Neck: Normal range of motion. Neck supple. No JVD present.  Mild midline tenderness  Cardiovascular: Normal rate, regular rhythm and normal heart sounds.  Exam reveals no gallop and no friction rub.   No murmur heard. Pulmonary/Chest: Effort normal and breath sounds normal. No respiratory distress. He has no wheezes. He has no rales. He exhibits no tenderness.  Abdominal: Soft. He exhibits no distension and no mass. There is no tenderness. There is no rebound and no guarding.  Musculoskeletal: Normal  range of motion. He exhibits no edema or tenderness.  Left Upper Extremity: ROM and strength 5/5, tenderness to palpation about the elbow without obvious deformity or abnormality  Remaining extremities are normal  Neurological: He is alert and oriented to person, place, and time.  Sensation and strength intact throughout  Skin: Skin is warm and dry.  Psychiatric: He has a normal mood and affect. His behavior is normal. Judgment and thought content normal.  Nursing note and vitals reviewed.    ED Treatments / Results  Labs (all  labs ordered are listed, but only abnormal results are displayed) Labs Reviewed - No data to display  EKG  EKG Interpretation None       Radiology Dg Forearm Left  Result Date: 05/22/2016 CLINICAL DATA:  Pain following fall EXAM: LEFT FOREARM - 2 VIEW COMPARISON:  Left elbow January 13, 2014 FINDINGS: Frontal and lateral views were obtained. No fracture or dislocation. Joint spaces appear normal. No elbow joint effusion. IMPRESSION: No fracture or dislocation.  No evident arthropathy. Electronically Signed   By: Bretta BangWilliam  Woodruff III M.D.   On: 05/22/2016 11:09   Ct Head Wo Contrast  Result Date: 05/22/2016 CLINICAL DATA:  Pain following fall EXAM: CT HEAD WITHOUT CONTRAST CT CERVICAL SPINE WITHOUT CONTRAST TECHNIQUE: Multidetector CT imaging of the head and cervical spine was performed following the standard protocol without intravenous contrast. Multiplanar CT image reconstructions of the cervical spine were also generated. COMPARISON:  None. FINDINGS: CT HEAD FINDINGS Brain: The ventricles are normal in size and configuration. No intracranial mass, hemorrhage, extra-axial fluid collection, or midline shift. There is minimal small vessel disease in the centra semiovale bilaterally. Elsewhere gray-white compartments appear normal. No acute infarct evident. Vascular: No hyperdense vessels. There is no appreciable vascular calcification. Skull: The bony calvarium appears intact. Sinuses/Orbits: There is opacification of multiple ethmoid air cells bilaterally. There is mucosal thickening in each posterior sphenoid sinus region. There is mild mucosal thickening in the medial left frontal sinus region. No air-fluid levels evident. Other visualized paranasal sinuses are clear. Visualized orbits appear symmetric bilaterally. Other: Visualized mastoid air cells are clear. CT CERVICAL SPINE FINDINGS Alignment: There is no evidence spondylolisthesis. Skull base and vertebrae: Skull base and craniocervical  junction regions appear normal. No evident fracture. No blastic or lytic bone lesions. Soft tissues and spinal canal: Prevertebral soft tissues and predental space regions are normal. No paraspinous lesions. No spinal stenosis. No cord or canal hematoma evident. Disc levels: Disc spaces appear unremarkable. No nerve root edema or effacement. No evident disc extrusion. Upper chest: Limited assessment unremarkable. Other: None IMPRESSION: CT head: Minimal periventricular small vessel disease. No intracranial mass, hemorrhage, extra-axial fluid collection. No acute infarct. There are areas of paranasal sinus disease. CT cervical spine: No fracture or spondylolisthesis. No appreciable arthropathic change. Electronically Signed   By: Bretta BangWilliam  Woodruff III M.D.   On: 05/22/2016 11:14   Ct Cervical Spine Wo Contrast  Result Date: 05/22/2016 CLINICAL DATA:  Pain following fall EXAM: CT HEAD WITHOUT CONTRAST CT CERVICAL SPINE WITHOUT CONTRAST TECHNIQUE: Multidetector CT imaging of the head and cervical spine was performed following the standard protocol without intravenous contrast. Multiplanar CT image reconstructions of the cervical spine were also generated. COMPARISON:  None. FINDINGS: CT HEAD FINDINGS Brain: The ventricles are normal in size and configuration. No intracranial mass, hemorrhage, extra-axial fluid collection, or midline shift. There is minimal small vessel disease in the centra semiovale bilaterally. Elsewhere gray-white compartments appear normal. No acute infarct evident. Vascular:  No hyperdense vessels. There is no appreciable vascular calcification. Skull: The bony calvarium appears intact. Sinuses/Orbits: There is opacification of multiple ethmoid air cells bilaterally. There is mucosal thickening in each posterior sphenoid sinus region. There is mild mucosal thickening in the medial left frontal sinus region. No air-fluid levels evident. Other visualized paranasal sinuses are clear. Visualized  orbits appear symmetric bilaterally. Other: Visualized mastoid air cells are clear. CT CERVICAL SPINE FINDINGS Alignment: There is no evidence spondylolisthesis. Skull base and vertebrae: Skull base and craniocervical junction regions appear normal. No evident fracture. No blastic or lytic bone lesions. Soft tissues and spinal canal: Prevertebral soft tissues and predental space regions are normal. No paraspinous lesions. No spinal stenosis. No cord or canal hematoma evident. Disc levels: Disc spaces appear unremarkable. No nerve root edema or effacement. No evident disc extrusion. Upper chest: Limited assessment unremarkable. Other: None IMPRESSION: CT head: Minimal periventricular small vessel disease. No intracranial mass, hemorrhage, extra-axial fluid collection. No acute infarct. There are areas of paranasal sinus disease. CT cervical spine: No fracture or spondylolisthesis. No appreciable arthropathic change. Electronically Signed   By: Bretta Bang III M.D.   On: 05/22/2016 11:14    Procedures Procedures (including critical care time)  Medications Ordered in ED Medications  HYDROcodone-acetaminophen (NORCO/VICODIN) 5-325 MG per tablet 2 tablet (not administered)  ondansetron (ZOFRAN-ODT) disintegrating tablet 4 mg (4 mg Oral Given 05/22/16 1041)     Initial Impression / Assessment and Plan / ED Course  I have reviewed the triage vital signs and the nursing notes.  Pertinent labs & imaging results that were available during my care of the patient were reviewed by me and considered in my medical decision making (see chart for details).     Patient with mechanical fall approximately one hour ago. He fell backward striking his head on the ground. He did not lose consciousness. He has not had vomiting. He has no numbness, weakness, or tingling. No vision changes. He does seem slightly confused, concern for concussion. Given the size of the hematoma and the slight confusion, feel that CT  imaging is indicated. We'll treat pain. Will also check plain films of left elbow/forearm.  Imaging is negative.  Will discharge to home with head injury/concussion return precautions and instructions.  Patient and family members agree with the plan.  He is stable and ready for discharge.  Final Clinical Impressions(s) / ED Diagnoses   Final diagnoses:  Injury of head, initial encounter  Neck pain  Pain of left upper extremity  Concussion without loss of consciousness, initial encounter    New Prescriptions New Prescriptions   No medications on file     Roxy Horseman, PA-C 05/22/16 1152    Lavera Guise, MD 05/22/16 1945

## 2016-10-31 ENCOUNTER — Encounter (HOSPITAL_BASED_OUTPATIENT_CLINIC_OR_DEPARTMENT_OTHER): Payer: Self-pay | Admitting: *Deleted

## 2016-10-31 ENCOUNTER — Emergency Department (HOSPITAL_BASED_OUTPATIENT_CLINIC_OR_DEPARTMENT_OTHER): Payer: BLUE CROSS/BLUE SHIELD

## 2016-10-31 ENCOUNTER — Emergency Department (HOSPITAL_BASED_OUTPATIENT_CLINIC_OR_DEPARTMENT_OTHER)
Admission: EM | Admit: 2016-10-31 | Discharge: 2016-10-31 | Disposition: A | Discharge status: Home / Self Care | Payer: BLUE CROSS/BLUE SHIELD | Attending: Physician Assistant | Admitting: Physician Assistant

## 2016-10-31 DIAGNOSIS — Z79899 Other long term (current) drug therapy: Secondary | ICD-10-CM | POA: Diagnosis not present

## 2016-10-31 DIAGNOSIS — B029 Zoster without complications: Secondary | ICD-10-CM | POA: Insufficient documentation

## 2016-10-31 DIAGNOSIS — M79642 Pain in left hand: Secondary | ICD-10-CM | POA: Diagnosis present

## 2016-10-31 MED ORDER — HYDROCODONE-ACETAMINOPHEN 5-325 MG PO TABS: 1.0000 | ORAL_TABLET | ORAL | 0 refills | Status: AC | PRN

## 2016-10-31 MED ORDER — GABAPENTIN 100 MG PO CAPS: 100.0000 mg | ORAL_CAPSULE | Freq: Three times a day (TID) | ORAL | 0 refills | Status: AC

## 2016-10-31 MED ORDER — HYDROCODONE-ACETAMINOPHEN 5-325 MG PO TABS
1.0000 | ORAL_TABLET | Freq: Once | ORAL | Status: AC
  Administered 2016-10-31: 1 via ORAL
  Filled 2016-10-31: qty 1

## 2016-10-31 MED ORDER — ACYCLOVIR 400 MG PO TABS: 800.0000 mg | ORAL_TABLET | Freq: Every day | ORAL | 0 refills | Status: AC

## 2016-10-31 MED ORDER — PREDNISONE 10 MG PO TABS: 20.0000 mg | ORAL_TABLET | Freq: Every day | ORAL | 0 refills | Status: AC

## 2016-10-31 NOTE — Discharge Instructions (Signed)
Take the medications as directed.   Take the pain medication as directed for pain.  Follow-up with her primary care doctor in 24-48 hours for further evaluation.  Return the emergency Department for any worsening rash, any worsening pain, numbness/weakness of the arm, fever or any other worsening or concerning symptoms.

## 2016-10-31 NOTE — ED Triage Notes (Signed)
Pt c/o left hand injury x 1 week ago

## 2016-10-31 NOTE — ED Provider Notes (Signed)
[facility] Provider Note   CSN: [id number] Arrival date & time: 10/31/16  1343     History   Chief Complaint Chief Complaint  Patient presents with  . Hand Injury    HPI Todd Griffin is a 51 y.o. male reports 1 week of worsening left hand pain after an injury 1 week ago. Patient reports that exactly 1 week ago a 2 x 6 in  wood plank fell on his hand from a 5 foot height. He was initially seen at [facility] in [place], [place] when the incident began. He had negative x-rays at that time and was discharged home with conservative therapy treatment. Patient reports that over the last week pain has worsened. He states that pain is now radiating up into his last forearm. He reports that he has been taking ibuprofen with no improvement in pain. He is also reporting some numbness to the left hand, particularly to the second and third digits of his hand. Of note, patient reports that a rash appeared to the volar aspect of his left hand yesterday. Patient denies any new trauma or injury.   The history is provided by the patient.    History reviewed. No pertinent past medical history.  Patient Active Problem List   Diagnosis Date Noted  . Headache(784.0) 02/01/2013  . Hypokalemia 02/01/2013  . Chest pain 01/31/2013    Past Surgical History:  Procedure Laterality Date  . HERNIA REPAIR    . LEFT HEART CATHETERIZATION WITH CORONARY ANGIOGRAM N/A 02/01/2013   Procedure: LEFT HEART CATHETERIZATION WITH CORONARY ANGIOGRAM;  Surgeon: Kathleene Hazel, MD;  Location: [facility] CATH LAB;  Service: Cardiovascular;  Laterality: N/A;       Home Medications    Prior to Admission medications   Medication Sig Start Date End Date Taking? Authorizing Provider  acetaminophen (TYLENOL) 325 MG tablet Take 650 mg by mouth every 6 (six) hours as needed (pain).    [provider]  acyclovir (ZOVIRAX) 400 MG tablet Take 2 tablets (800 mg total) by mouth 5 (five) times daily. 10/31/16    Maxwell Caul, PA-C  gabapentin (NEURONTIN) 100 MG capsule Take 1 capsule (100 mg total) by mouth 3 (three) times daily. 10/31/16   Maxwell Caul, PA-C  HYDROcodone-acetaminophen (NORCO/VICODIN) 5-325 MG tablet Take 1-2 tablets by mouth every 4 (four) hours as needed. 10/31/16   Maxwell Caul, PA-C  predniSONE (DELTASONE) 10 MG tablet Take 2 tablets (20 mg total) by mouth daily. 10/31/16   Maxwell Caul, PA-C    Family History History reviewed. No pertinent family history.  Social History Social History  Substance Use Topics  . Smoking status: Never Smoker  . Smokeless tobacco: Never Used  . Alcohol use No     Allergies   Sulfa antibiotics   Review of Systems Review of Systems  Musculoskeletal: Positive for arthralgias and joint swelling.  Skin: Positive for rash.  Neurological: Positive for numbness.     Physical Exam Updated Vital Signs BP 105/67   Pulse 76   Temp 97.9 F (36.6 C)   Resp 18   Ht 5\' 10"  (1.778 m)   Wt 86.2 kg (190 lb)   SpO2 100%   BMI 27.26 kg/m   Physical Exam  Constitutional: He is oriented to person, place, and time. He appears well-developed and well-nourished.  HENT:  Head: Normocephalic and atraumatic.  Eyes: Conjunctivae and EOM are normal. Right eye exhibits no discharge. Left eye exhibits no discharge. No scleral icterus.  Cardiovascular:  Pulses:      Radial pulses are 2+ on the right side, and 2+ on the left side.  Pulmonary/Chest: Effort normal.  Musculoskeletal:  Tenderness to palpation to the ulnar and radial aspect of the left wrist. No snuffbox tenderness. Flexion/Extension of the left wrist intact but with reports of pain. Patient is able to fully flex and extend all 5 digits of the left hand. Abduction/adduction of fingers and opposition of thumb intact. Patient reports subjective pain that extends into the left forearm. No bony tenderness. No deformity or crepitus noted.  Neurological: He is alert and oriented to  person, place, and time. GCS eye subscore is 4. GCS verbal subscore is 5. GCS motor subscore is 6.  Patient is able to differentiate between sharp and dull throughout major nerve distributions of the hand bilaterally.  Skin: Skin is warm and dry. Capillary refill takes less than 2 seconds.  Small area of erythematous vesicles noted to the palmar aspect of left hand just medial to the left thumb. There is also small vesicles that are beginning to spread around to the webspace of the thumb. No rash noted to the right upper extremity.  Psychiatric: He has a normal mood and affect. His speech is normal and behavior is normal.  Nursing note and vitals reviewed.    ED Treatments / Results  Labs (all labs ordered are listed, but only abnormal results are displayed) Labs Reviewed - No data to display  EKG  EKG Interpretation None       Radiology Dg Wrist Complete Left  Result Date: 10/31/2016 CLINICAL DATA:  Fall last week.  Pain and swelling EXAM: LEFT WRIST - COMPLETE 3+ VIEW COMPARISON:  None. FINDINGS: Normal alignment no fracture. Mild degenerative change in the wrist joint. IMPRESSION: Negative for fracture. Electronically Signed   By: Marlan Palau M.D.   On: 10/31/2016 14:13   Dg Hand Complete Left  Result Date: 10/31/2016 CLINICAL DATA:  Fall last week.  Pain and swelling EXAM: LEFT HAND - COMPLETE 3+ VIEW COMPARISON:  None. FINDINGS: There is no evidence of fracture or dislocation. There is no evidence of arthropathy or other focal bone abnormality. Soft tissues are unremarkable. IMPRESSION: Negative. Electronically Signed   By: Marlan Palau M.D.   On: 10/31/2016 14:12    Procedures Procedures (including critical care time)  Medications Ordered in ED Medications  HYDROcodone-acetaminophen (NORCO/VICODIN) 5-325 MG per tablet 1 tablet (1 tablet Oral Given 10/31/16 1425)     Initial Impression / Assessment and Plan / ED Course  I have reviewed the triage vital signs and the  nursing notes.  Pertinent labs & imaging results that were available during my care of the patient were reviewed by me and considered in my medical decision making (see chart for details).     51 year old male who presents with 1 week of worsening left hand pain.  He also had a small rash that appeared yesterday. Patient is afebrile, non-toxic appearing, sitting comfortably on examination table. Vital signs reviewed and stable. Patient is neurovascularly intact. Patient is able to differentiate between sharp and dull to all major nerve distributions of the hand. He does have a vesicular rash noted to the palmar aspect of the left hand. No other rash anywhere else. Consider fracture versus dislocation of the wrist. Also consider sprain of the wrist. Vesicular rash noted on the palmar aspect of his left hand is concerning for shingles. The increased pain, in addition to the pain extending up the  left forearm, that he is experiencing could be attributed to pain from shingles. Will evaluate with XR to make sure there is no fracture or dislocation.  X-rays ordered at triage. Analgesics given in the department.  X-rays reviewed. Negative for any acute fracture or dislocation. Discussed case with Dr. Corlis LeakMackuen who saw patient. Given the appearance of rash, appears to be consistent with shingles. Will plan to treat accordingly. Discussed results with patient. Will provide symptomatically treatment. Patient instructed to follow-up with his primary care doctor next 24-40 further evaluation. Strict return precautions discussed. Patient expresses understanding and agreement to plan.    Final Clinical Impressions(s) / ED Diagnoses   Final diagnoses:  Herpes zoster without complication    New Prescriptions Discharge Medication List as of 10/31/2016  3:11 PM    START taking these medications   Details  acyclovir (ZOVIRAX) 400 MG tablet Take 2 tablets (800 mg total) by mouth 5 (five) times daily., Starting Thu  10/31/2016, Print    gabapentin (NEURONTIN) 100 MG capsule Take 1 capsule (100 mg total) by mouth 3 (three) times daily., Starting Thu 10/31/2016, Print    HYDROcodone-acetaminophen (NORCO/VICODIN) 5-325 MG tablet Take 1-2 tablets by mouth every 4 (four) hours as needed., Starting Thu 10/31/2016, Print    predniSONE (DELTASONE) 10 MG tablet Take 2 tablets (20 mg total) by mouth daily., Starting Thu 10/31/2016, Print         Maxwell CaulLayden, Jennea Rager A, PA-C 10/31/16 1553    Abelino DerrickMackuen, Courteney Lyn, MD 11/04/16 2046

## 2018-03-23 IMAGING — CR DG HAND COMPLETE 3+V*L*
3 series · 3 of 3 positions shown · non-contrast
Comparison: None.

CLINICAL DATA: Fall last week.  Pain and swelling

EXAM:
LEFT HAND - COMPLETE 3+ VIEW

[x hand pa left]
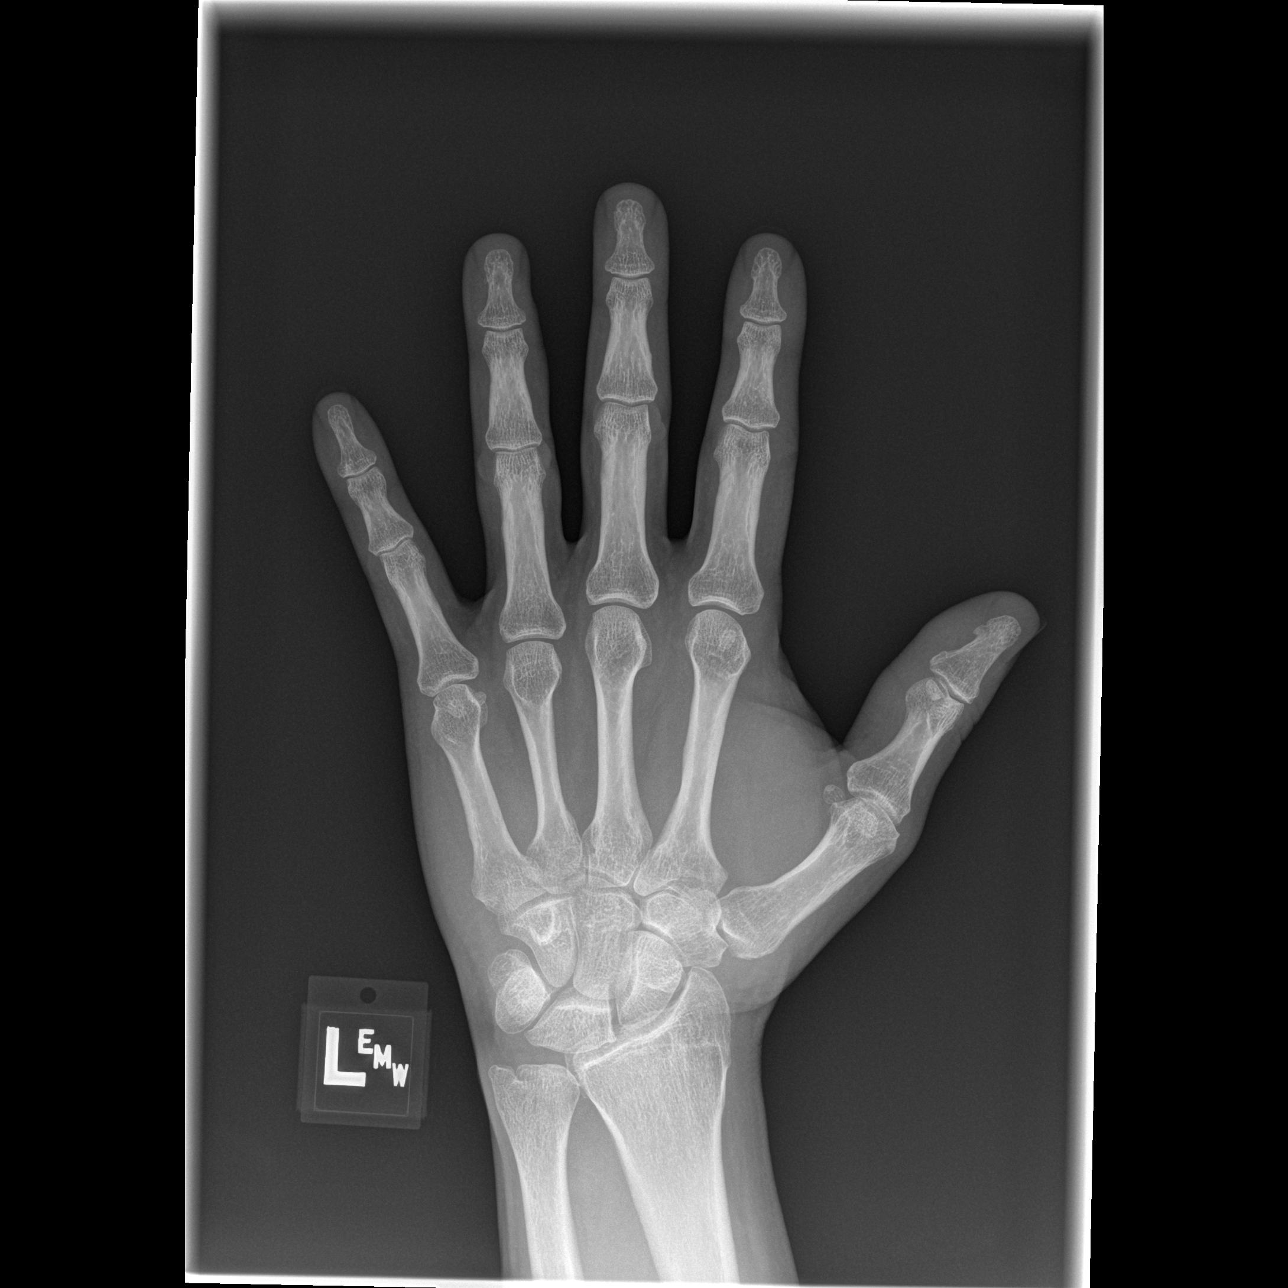

[x hand oblique left]
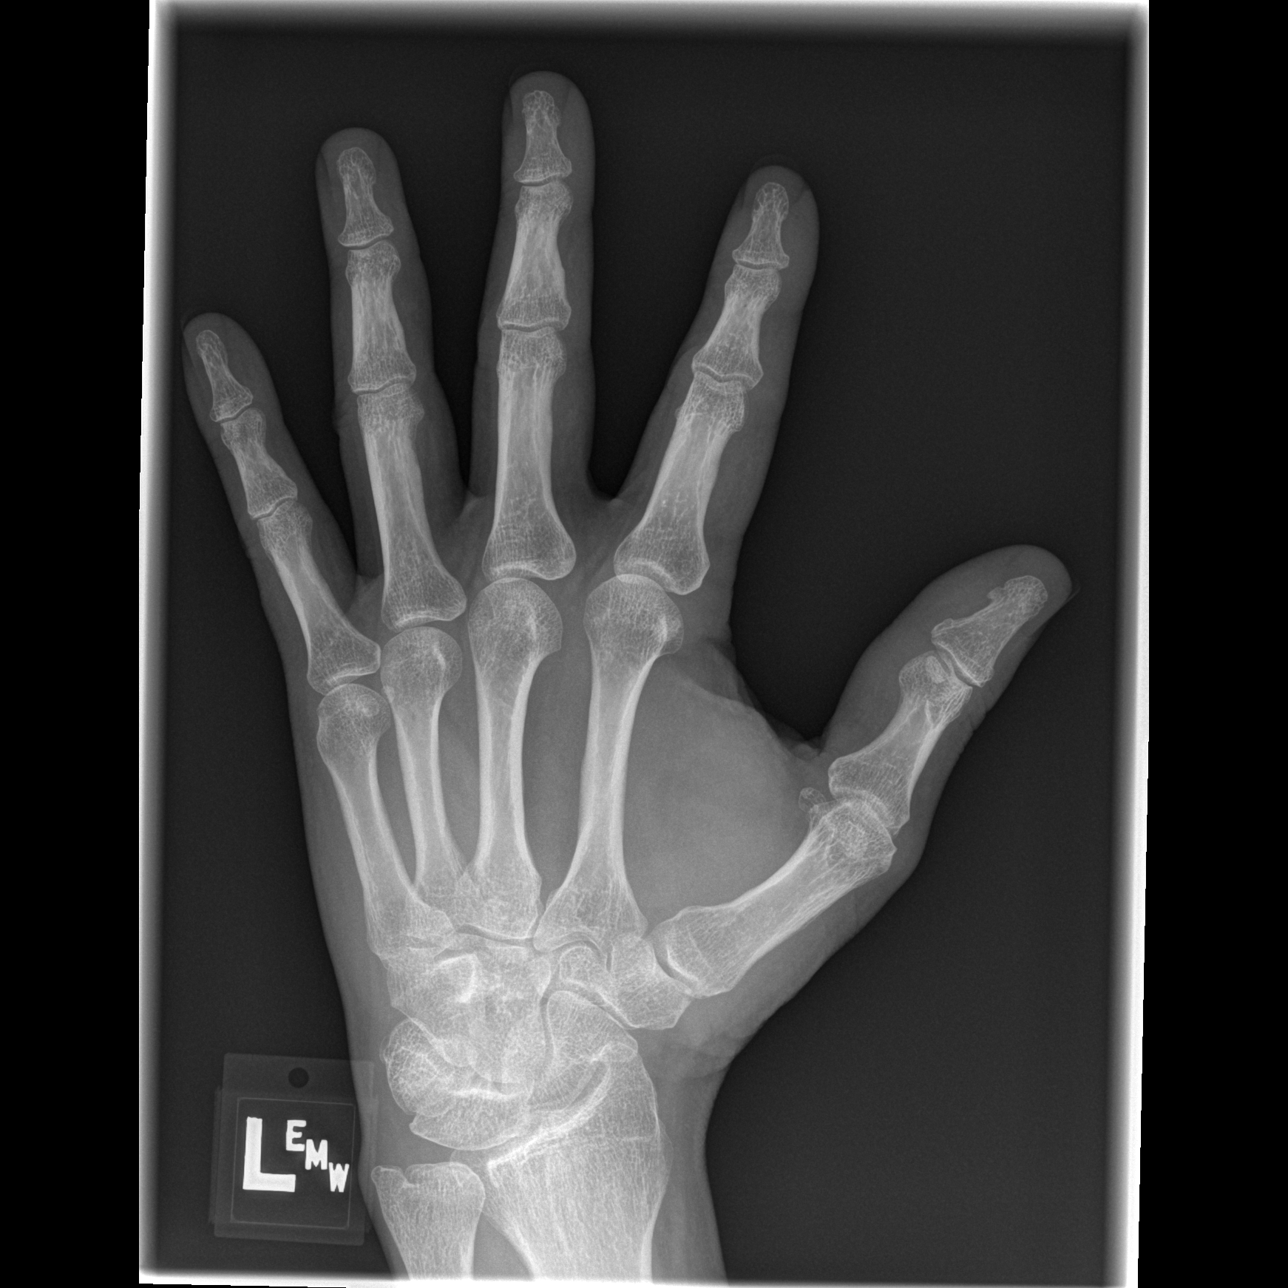

[x hand lat left]
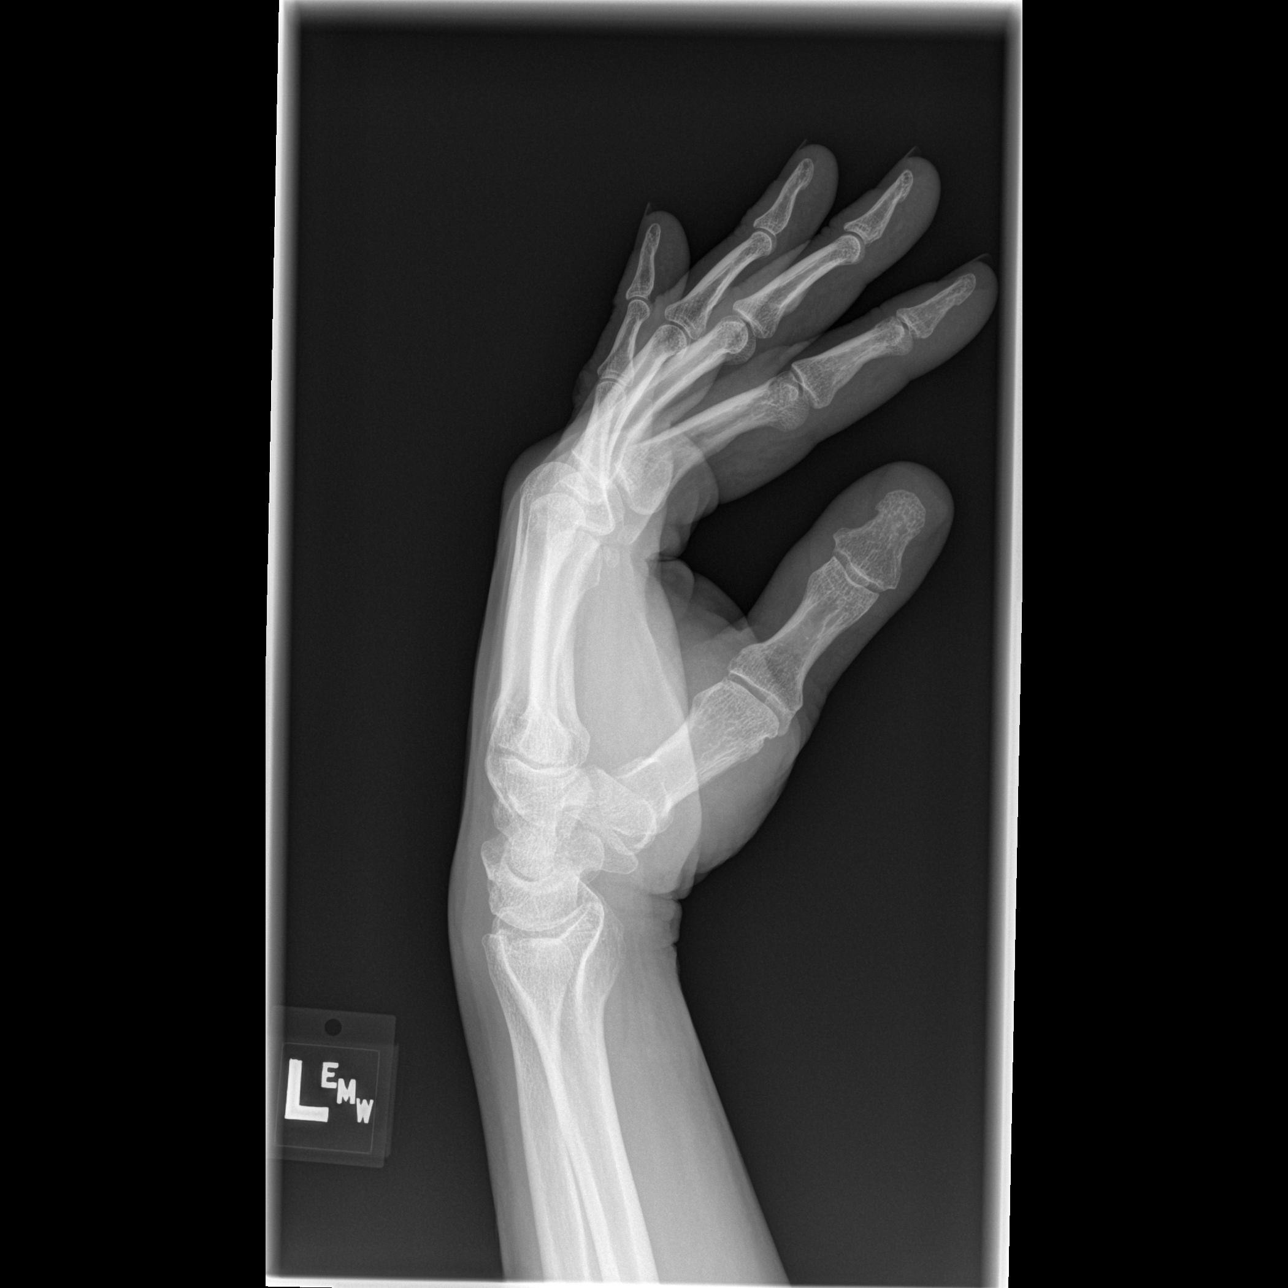

[3 of 3 positions shown; findings below may reference images not displayed]

FINDINGS: There is no evidence of fracture or dislocation. There is no
evidence of arthropathy or other focal bone abnormality. Soft
tissues are unremarkable.
IMPRESSION: Negative.

## 2018-03-23 IMAGING — CR DG WRIST COMPLETE 3+V*L*
4 series · 4 of 4 positions shown · non-contrast
Comparison: None.

CLINICAL DATA: Fall last week.  Pain and swelling

EXAM:
LEFT WRIST - COMPLETE 3+ VIEW

[x wrist pa left]
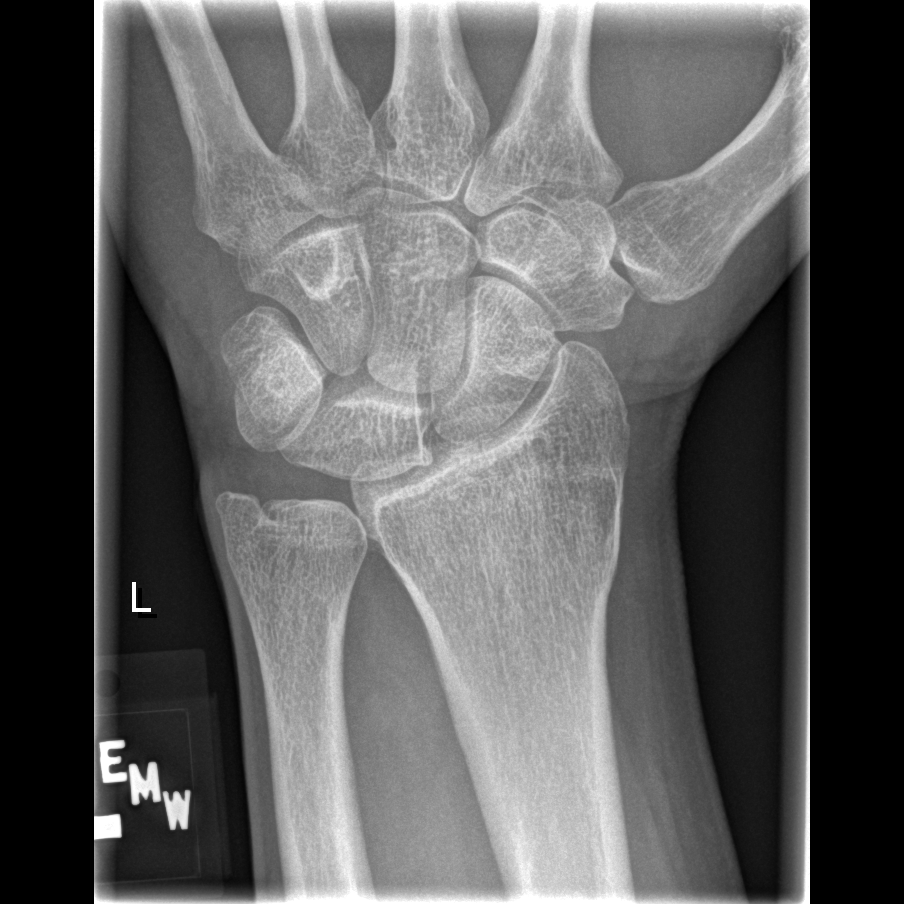

[x wrist obl left]
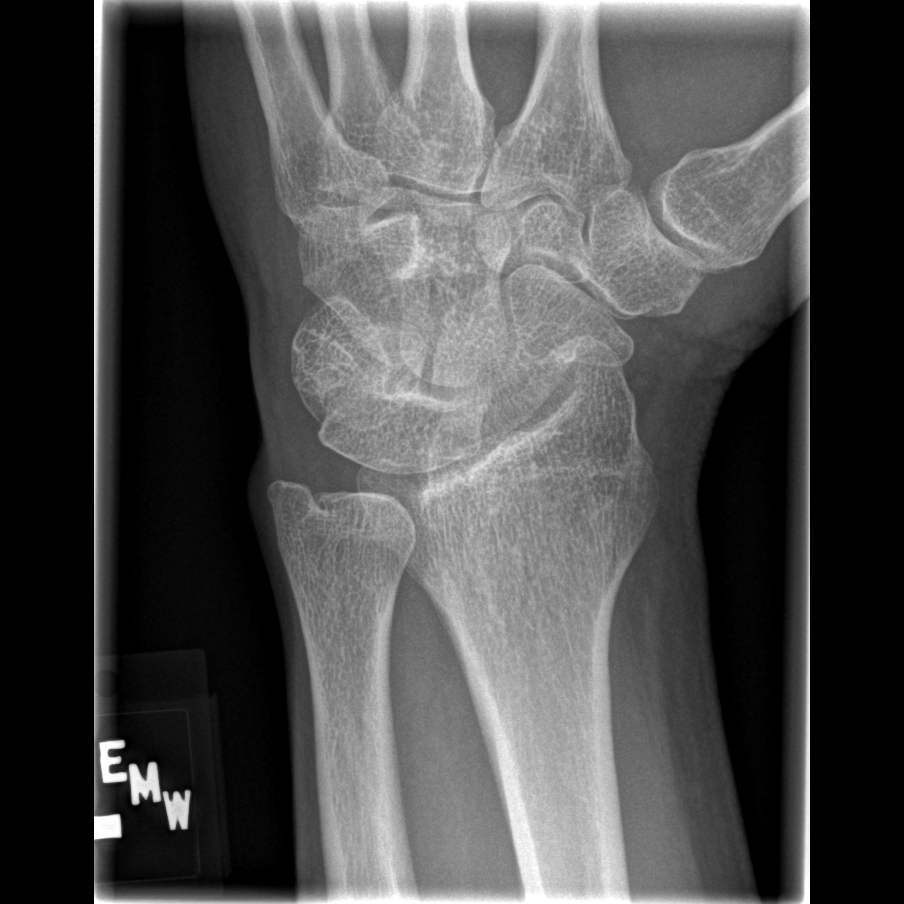

[x wrist lat left]
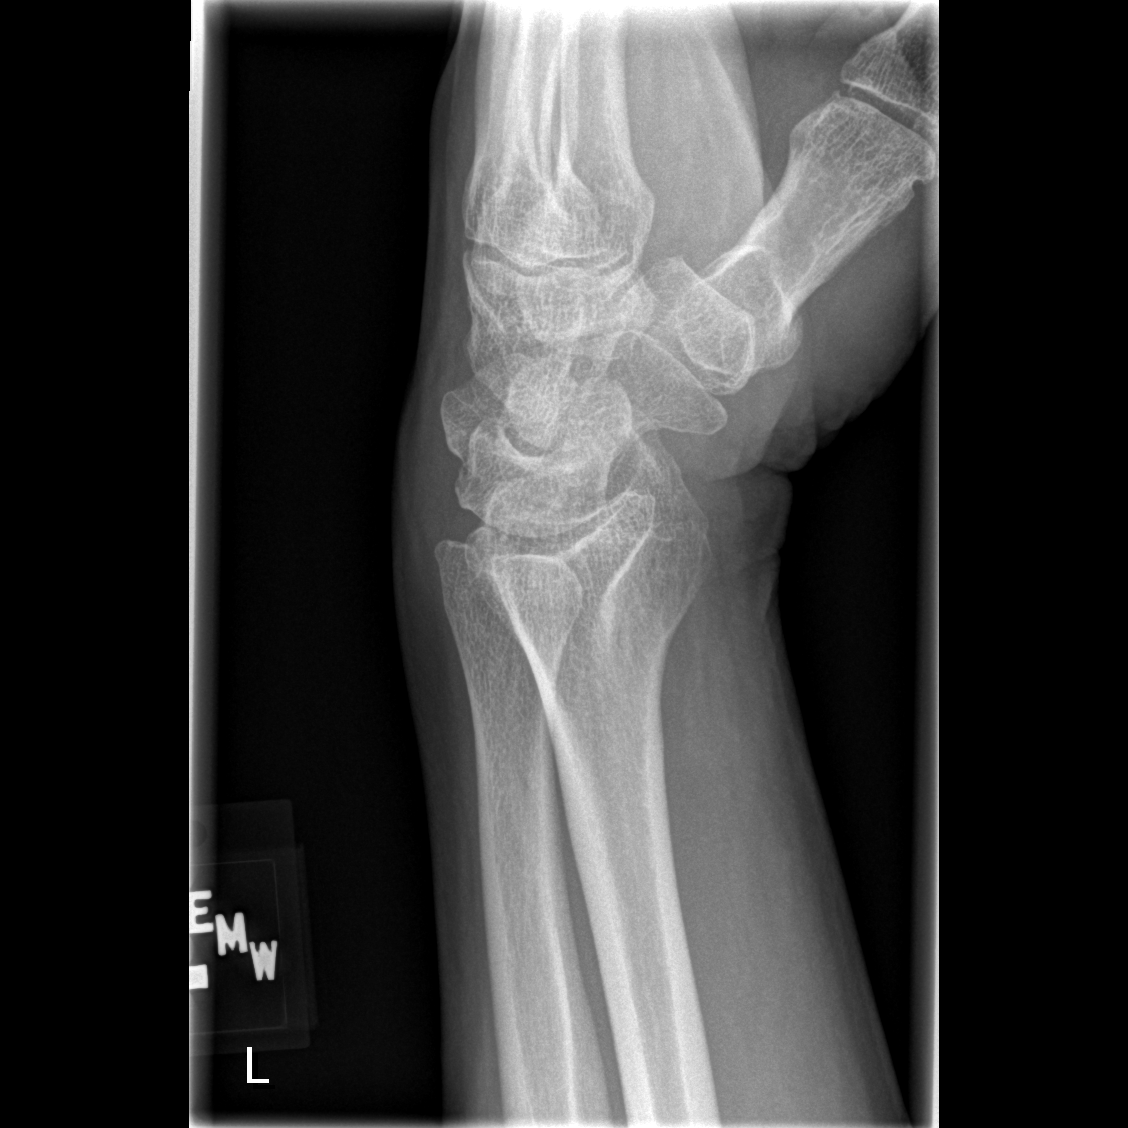

[x navicular]
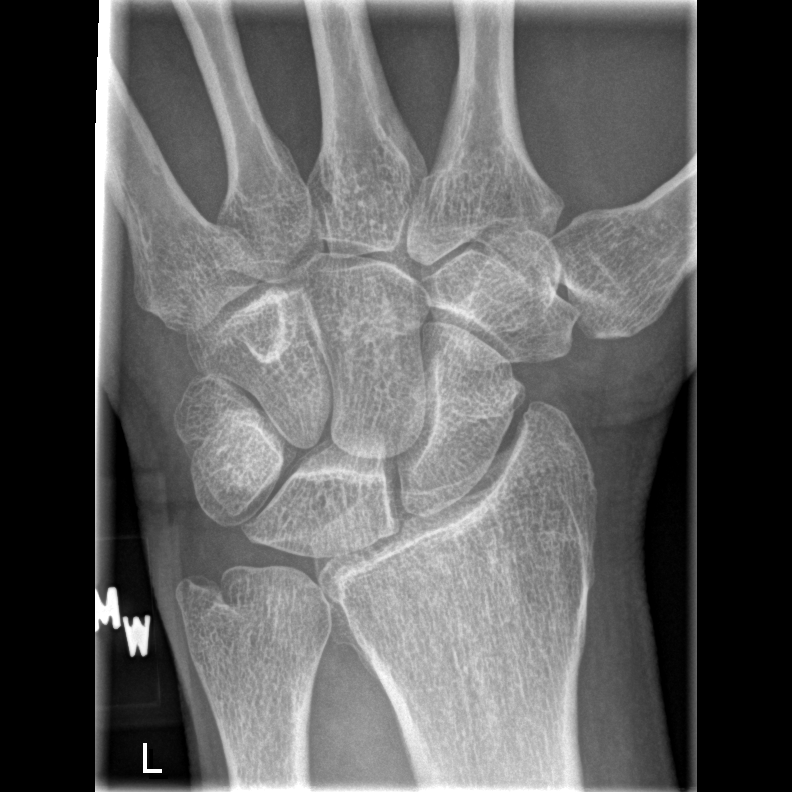

[4 of 4 positions shown; findings below may reference images not displayed]

FINDINGS: Normal alignment no fracture. Mild degenerative change in the wrist
joint.
IMPRESSION: Negative for fracture.

## 2019-02-18 ENCOUNTER — Other Ambulatory Visit (HOSPITAL_COMMUNITY): Payer: Self-pay | Admitting: Gastroenterology

## 2019-02-18 ENCOUNTER — Other Ambulatory Visit: Payer: Self-pay | Admitting: Gastroenterology

## 2019-02-18 DIAGNOSIS — R1011 Right upper quadrant pain: Secondary | ICD-10-CM

## 2019-03-02 ENCOUNTER — Encounter (HOSPITAL_COMMUNITY): Payer: BC Managed Care – PPO | Attending: Gastroenterology

## 2019-03-02 ENCOUNTER — Encounter (HOSPITAL_COMMUNITY): Payer: Self-pay

## 2019-03-02 ENCOUNTER — Ambulatory Visit (HOSPITAL_COMMUNITY): Payer: BC Managed Care – PPO

## 2020-04-17 ENCOUNTER — Other Ambulatory Visit: Payer: Self-pay | Admitting: Gastroenterology

## 2020-04-17 DIAGNOSIS — R1011 Right upper quadrant pain: Secondary | ICD-10-CM
